# Patient Record
Sex: Male | Born: 1950 | Race: Black or African American | Hispanic: No | Marital: Married | Smoking: Never smoker
Health system: Southern US, Community
[De-identification: ages and names within clinical notes are randomized; demographics above are authoritative.]

## PROBLEM LIST (undated history)

## (undated) DIAGNOSIS — E119 Type 2 diabetes mellitus without complications: Secondary | ICD-10-CM

## (undated) DIAGNOSIS — I10 Essential (primary) hypertension: Secondary | ICD-10-CM

## (undated) DIAGNOSIS — E785 Hyperlipidemia, unspecified: Secondary | ICD-10-CM

## (undated) HISTORY — PX: OTHER SURGICAL HISTORY: SHX169

---

## 1998-06-30 ENCOUNTER — Ambulatory Visit (HOSPITAL_BASED_OUTPATIENT_CLINIC_OR_DEPARTMENT_OTHER): Admission: RE | Admit: 1998-06-30 | Discharge: 1998-06-30 | Payer: Self-pay | Admitting: *Deleted

## 1998-08-27 ENCOUNTER — Ambulatory Visit (HOSPITAL_BASED_OUTPATIENT_CLINIC_OR_DEPARTMENT_OTHER): Admission: RE | Admit: 1998-08-27 | Discharge: 1998-08-27 | Payer: Self-pay | Admitting: Orthopedic Surgery

## 2008-12-09 ENCOUNTER — Emergency Department (HOSPITAL_BASED_OUTPATIENT_CLINIC_OR_DEPARTMENT_OTHER): Admission: EM | Admit: 2008-12-09 | Discharge: 2008-12-09 | Payer: Self-pay | Admitting: Emergency Medicine

## 2008-12-09 ENCOUNTER — Ambulatory Visit: Payer: Self-pay | Admitting: Radiology

## 2012-10-30 ENCOUNTER — Emergency Department (HOSPITAL_BASED_OUTPATIENT_CLINIC_OR_DEPARTMENT_OTHER): Payer: 59

## 2012-10-30 ENCOUNTER — Emergency Department (HOSPITAL_BASED_OUTPATIENT_CLINIC_OR_DEPARTMENT_OTHER)
Admission: EM | Admit: 2012-10-30 | Discharge: 2012-10-31 | Disposition: A | Discharge status: Home / Self Care | Payer: 59 | Attending: Emergency Medicine | Admitting: Emergency Medicine

## 2012-10-30 ENCOUNTER — Encounter (HOSPITAL_BASED_OUTPATIENT_CLINIC_OR_DEPARTMENT_OTHER): Payer: Self-pay | Admitting: Emergency Medicine

## 2012-10-30 DIAGNOSIS — Z8639 Personal history of other endocrine, nutritional and metabolic disease: Secondary | ICD-10-CM | POA: Insufficient documentation

## 2012-10-30 DIAGNOSIS — E119 Type 2 diabetes mellitus without complications: Secondary | ICD-10-CM | POA: Insufficient documentation

## 2012-10-30 DIAGNOSIS — I10 Essential (primary) hypertension: Secondary | ICD-10-CM | POA: Insufficient documentation

## 2012-10-30 DIAGNOSIS — R1013 Epigastric pain: Secondary | ICD-10-CM | POA: Insufficient documentation

## 2012-10-30 DIAGNOSIS — D72829 Elevated white blood cell count, unspecified: Secondary | ICD-10-CM

## 2012-10-30 DIAGNOSIS — K219 Gastro-esophageal reflux disease without esophagitis: Secondary | ICD-10-CM | POA: Insufficient documentation

## 2012-10-30 DIAGNOSIS — Z79899 Other long term (current) drug therapy: Secondary | ICD-10-CM | POA: Insufficient documentation

## 2012-10-30 DIAGNOSIS — Z862 Personal history of diseases of the blood and blood-forming organs and certain disorders involving the immune mechanism: Secondary | ICD-10-CM | POA: Insufficient documentation

## 2012-10-30 HISTORY — DX: Essential (primary) hypertension: I10

## 2012-10-30 HISTORY — DX: Type 2 diabetes mellitus without complications: E11.9

## 2012-10-30 HISTORY — DX: Hyperlipidemia, unspecified: E78.5

## 2012-10-30 LAB — TROPONIN I: Troponin I: <0.3 ng/mL (ref <0.30)

## 2012-10-30 MED ORDER — GI COCKTAIL ~~LOC~~
30.0000 mL | Freq: Once | ORAL | Status: AC
  Administered 2012-10-30: 30 mL via ORAL
  Filled 2012-10-30: qty 30

## 2012-10-30 NOTE — ED Notes (Signed)
Patient transported to X-ray and returned 

## 2012-10-30 NOTE — ED Provider Notes (Signed)
CSN: [id number]     Arrival date & time 10/30/12  2209 History  This chart was scribed for [name], MD by Bennett Scrape, ED Scribe. This patient was seen in room MH06/MH06 and the patient's care was started at 11:07 PM.   First MD Initiated Contact with Patient 10/30/12 2300     Chief Complaint  Patient presents with  . Chest Pain    Patient is a 62 y.o. male presenting with chest pain. The history is provided by the patient. No language interpreter was used.  Chest Pain Pain location:  Substernal area Pain quality: not dull, no pressure and not sharp   Pain radiates to:  Does not radiate Pain radiates to the back: no   Pain severity:  Moderate Onset quality:  Gradual Duration:  18 hours Timing:  Constant Progression:  Unchanged Chronicity:  New Context: not breathing, no movement and no trauma   Relieved by:  Nothing Worsened by:  Nothing tried Ineffective treatments:  None tried Associated symptoms: abdominal pain   Associated symptoms: no cough, no diaphoresis, no fever, no nausea, no palpitations, no shortness of breath, not vomiting and no weakness   Associated symptoms comment:  Pain is below the xiphoid Abdominal pain:    Location:  Epigastric   Severity:  Moderate   Onset quality:  Gradual   Duration:  18 hours   Timing:  Constant   Progression:  Unchanged   Chronicity:  New Risk factors: male sex     HPI Comments: Timothy Rocha is a 62 y.o. male who presents to the Emergency Department complaining of CP episodes located sub sternally/ epigastric. He is hesitant to describe the pain saying "It's not dull or sharp". The pain has been felt constantly since 6:30 AM this morning and has improved since arrival to the ED. He rates his pain a 1 out of 10 currently. He  denies diaphoresis, nausea, cough, neck pain, shoulder pain and arm pain as associated symptoms. He denies any recent long car trips, leg swelling or calf pain. He denies SOB with exertion or  climbing stairs. He reports one recent episode of GERD after drinking lemonade last week but denies having a h/o frequent GERD. He admits that he played golf today with no complications, no shortness of breath and symptoms were not exacerbated by walking nor playing. He has a h/o DM and reports CBG around 200. He also admits to drinking sweet tea at dinner tonight.   PCP is with [facility]  Past Medical History  Diagnosis Date  . Hyperlipemia   . Diabetes mellitus without complication   . Hypertension    Past Surgical History  Procedure Laterality Date  . Rotator cup      No family history on file. History  Substance Use Topics  . Smoking status: Not on file  . Smokeless tobacco: Not on file  . Alcohol Use: Not on file    Review of Systems  Constitutional: Negative for fever, diaphoresis, appetite change and unexpected weight change.  HENT: Negative for neck pain and neck stiffness.   Respiratory: Negative for cough and shortness of breath.   Cardiovascular: Positive for chest pain. Negative for palpitations and leg swelling.  Gastrointestinal: Positive for abdominal pain. Negative for nausea, vomiting and diarrhea.  Skin: Negative for rash.  Neurological: Negative for weakness.  Hematological: Negative for adenopathy. Does not bruise/bleed easily.  All other systems reviewed and are negative.    Allergies  Oxycodone  Home Medications  Current Outpatient Rx  Name  Route  Sig  Dispense  Refill  . lansoprazole (PREVACID) 30 MG capsule   Oral   Take 30 mg by mouth daily.         . metFORMIN (GLUCOPHAGE) 500 MG tablet   Oral   Take 500 mg by mouth 2 (two) times daily with a meal.         . valsartan (DIOVAN) 160 MG tablet   Oral   Take 160 mg by mouth daily.          Triage Vitals: BP 146/91  Pulse 92  Resp 20  Ht 5\' 10"  (1.778 m)  Wt 238 lb (107.956 kg)  BMI 34.15 kg/m2  SpO2 97%  Physical Exam  Nursing note and vitals reviewed. Constitutional:  He is oriented to person, place, and time. He appears well-developed and well-nourished. No distress.  HENT:  Head: Normocephalic and atraumatic.  Mouth/Throat: Oropharynx is clear and moist. No oropharyngeal exudate.  Eyes: Conjunctivae and EOM are normal. Pupils are equal, round, and reactive to light.  Sclera are clear  Neck: Neck supple. No tracheal deviation present.  No epitrochlear, no supraclavicular nor groin lymph adenopathy  Cardiovascular: Normal rate, regular rhythm, normal heart sounds and intact distal pulses.   No murmur heard. Pulmonary/Chest: Effort normal and breath sounds normal. No respiratory distress. He has no wheezes.  Abdominal: Soft. Bowel sounds are normal. He exhibits no distension and no mass. There is tenderness. There is no rebound and no guarding.  Tenderness underneath the xyphoid process in the abdominal cavity, epigastric, No RUQ no LUQ tenderness  Musculoskeletal: Normal range of motion. He exhibits no edema (no ankle swelling).  Lymphadenopathy:    He has no cervical adenopathy.  Neurological: He is alert and oriented to person, place, and time. No cranial nerve deficit.  Skin: Skin is warm and dry. No rash noted.  Psychiatric: He has a normal mood and affect. His behavior is normal.    ED Course   Procedures (including critical care time)  Medications  gi cocktail (Maalox,Lidocaine,Donnatal) (30 mLs Oral Given 10/30/12 2322)    DIAGNOSTIC STUDIES: Oxygen Saturation is 97% on room air, normal by my interpretation.    COORDINATION OF CARE: 11:11 PM-Discussed treatment plan which includes CXR, CBC panel, BMP and troponin with pt at bedside and pt agreed to plan. If admission is necessary, pt request [facility]. 12:25 AM-Pt rechecked and reports improvement with GI cocktail. Will Ct abdomen.  Labs Reviewed  GLUCOSE, CAPILLARY - Abnormal; Notable for the following:    Glucose-Capillary 289 (*)    All other components within normal  limits  CBC WITH DIFFERENTIAL  BASIC METABOLIC PANEL  TROPONIN I   No results found. No diagnosis found.  MDM  No signs of viral or bacterial infections on exam to explain elevated WBC count.  In the setting of > 8 hours of symptoms with a negative EKG and troponin ACS is exclude, symptoms are clearly in the abdomen.  No RUQ tenderness.  No stones seen on CT no bilirubin elevation. GI cocktail relieved symptoms consistent with GERD.  Will add carafate to patient's PPI regimen.    No blasts seen on smear, no enlarged lymph nodes seen on ct scan.  LFTs minimally elevated and consistent with steatosis and not consistent with hepatitis.  Mono is negative.  Patient will need to be ruled out for CLL and viral hepatitis.    EDP spoke with Dr. Ludwig Clarks, on call physician  at [facility] and partner to patient's PMD Dr. Glory Rosebush.  Labs and CT reviewed at length with Dr. Ludwig Clarks who will schedule an appointment with patient for today to do further lab testing and ongoing care.    If you are not called for an appointment with your doctor by noon today, contact them to be seen immediately.  Patient verbalizes understanding and agrees to follow up.      Date: 10/31/2012  Rate: 93  Rhythm: normal sinus rhythm  QRS Axis: normal  Intervals: normal  ST/T Wave abnormalities: normal  Conduction Disutrbances: none  Narrative Interpretation: unremarkable   EDP reviewed labs and CT with patient at length. > 20 minutes spent in consultation  I personally performed the services described in this documentation, which was scribed in my presence. The recorded information has been reviewed and is accurate.    Jasmine Awe, MD 10/31/12 [phone]

## 2012-10-30 NOTE — ED Notes (Signed)
Pt reports feeling tired, worn out last few days, awoke this am with centralized chest pain that radiates to right side of chest, denies n/v, sob

## 2012-10-31 ENCOUNTER — Emergency Department (HOSPITAL_BASED_OUTPATIENT_CLINIC_OR_DEPARTMENT_OTHER): Payer: 59

## 2012-10-31 LAB — CBC WITH DIFFERENTIAL/PLATELET
Basophils Relative: 0 % (ref 0–1)
Eosinophils Relative: 0 % (ref 0–5)
Hemoglobin: 13 g/dL (ref 13.0–17.0)
Lymphs Abs: 19.2 10*3/uL — ABNORMAL HIGH (ref 0.7–4.0)
MCV: 70.9 fL — ABNORMAL LOW (ref 78.0–100.0)
Monocytes Relative: 4 % (ref 3–12)
Neutrophils Relative %: 11 % — ABNORMAL LOW (ref 43–77)
Platelets: 118 10*3/uL — ABNORMAL LOW (ref 150–400)
RBC: 5.64 MIL/uL (ref 4.22–5.81)
Smear Review: DECREASED
WBC: 22.6 10*3/uL — ABNORMAL HIGH (ref 4.0–10.5)

## 2012-10-31 LAB — URINALYSIS, ROUTINE W REFLEX MICROSCOPIC
Leukocytes, UA: NEGATIVE
Nitrite: NEGATIVE
Specific Gravity, Urine: 1.034 — ABNORMAL HIGH (ref 1.005–1.030)
Urobilinogen, UA: 1 mg/dL (ref 0.0–1.0)

## 2012-10-31 LAB — LIPASE, BLOOD: Lipase: 28 U/L (ref 11–59)

## 2012-10-31 LAB — URINE MICROSCOPIC-ADD ON

## 2012-10-31 LAB — HEPATIC FUNCTION PANEL
Albumin: 3.9 g/dL (ref 3.5–5.2)
Alkaline Phosphatase: 165 U/L — ABNORMAL HIGH (ref 39–117)
Indirect Bilirubin: 0.5 mg/dL (ref 0.3–0.9)
Total Bilirubin: 0.6 mg/dL (ref 0.3–1.2)

## 2012-10-31 LAB — BASIC METABOLIC PANEL
Chloride: 102 mEq/L (ref 96–112)
GFR calc Af Amer: 82 mL/min — ABNORMAL LOW (ref >90)
Potassium: 4.4 mEq/L (ref 3.5–5.1)

## 2012-10-31 MED ORDER — SUCRALFATE 1 GM/10ML PO SUSP: 1.0000 g | Freq: Four times a day (QID) | ORAL | Status: DC

## 2012-10-31 MED ORDER — IOHEXOL 300 MG/ML  SOLN
100.0000 mL | Freq: Once | INTRAMUSCULAR | Status: AC | PRN
  Administered 2012-10-31: 100 mL via INTRAVENOUS

## 2012-10-31 MED ORDER — IOHEXOL 300 MG/ML  SOLN
50.0000 mL | Freq: Once | INTRAMUSCULAR | Status: AC | PRN
  Administered 2012-10-31: 50 mL via ORAL

## 2012-10-31 NOTE — ED Notes (Signed)
MD at bedside. 

## 2012-10-31 NOTE — ED Notes (Signed)
Patient transported to CT 

## 2012-10-31 NOTE — ED Notes (Signed)
rx x 1 for carafate. D/c with family

## 2012-10-31 NOTE — ED Notes (Signed)
Pt reports feeling chilled and shivering after finishing oral contrast. EDP made aware

## 2012-10-31 NOTE — ED Notes (Signed)
Pt's family member expressed concerns regarding need for CT scan to [place], [employer]. Lorin Picket, RN at bedside to discuss plan of care with pt and family. Pt now drinking contrast for CT scan

## 2012-10-31 NOTE — ED Notes (Signed)
Pt sts he feels better than when he came in. I discussed pt's abnormal labs and the importance of trying to find a cause for them and that he was free to leave but he could suddenly become sicker. Pt stated "I'm just tired" but he agreed to drink his contrast and have the CT done. Pt's daughter at bedside sts she felt like nothing was being done for the pt. I reviewed the pt's chart and per the timeline pt's only wait was to see the EDP. I explained that it has been a busy night and the EDP has been moving as quickly as she can. Pt friendly and smiling at conclusion of conversation.

## 2012-11-30 ENCOUNTER — Emergency Department (HOSPITAL_BASED_OUTPATIENT_CLINIC_OR_DEPARTMENT_OTHER): Payer: 59

## 2012-11-30 ENCOUNTER — Emergency Department (HOSPITAL_BASED_OUTPATIENT_CLINIC_OR_DEPARTMENT_OTHER)
Admission: EM | Admit: 2012-11-30 | Discharge: 2012-11-30 | Disposition: A | Discharge status: Home / Self Care | Payer: 59 | Attending: Emergency Medicine | Admitting: Emergency Medicine

## 2012-11-30 ENCOUNTER — Encounter (HOSPITAL_BASED_OUTPATIENT_CLINIC_OR_DEPARTMENT_OTHER): Payer: Self-pay | Admitting: *Deleted

## 2012-11-30 DIAGNOSIS — Y92410 Unspecified street and highway as the place of occurrence of the external cause: Secondary | ICD-10-CM | POA: Insufficient documentation

## 2012-11-30 DIAGNOSIS — S42001A Fracture of unspecified part of right clavicle, initial encounter for closed fracture: Secondary | ICD-10-CM

## 2012-11-30 DIAGNOSIS — Z8639 Personal history of other endocrine, nutritional and metabolic disease: Secondary | ICD-10-CM | POA: Insufficient documentation

## 2012-11-30 DIAGNOSIS — Y9389 Activity, other specified: Secondary | ICD-10-CM | POA: Insufficient documentation

## 2012-11-30 DIAGNOSIS — E119 Type 2 diabetes mellitus without complications: Secondary | ICD-10-CM | POA: Insufficient documentation

## 2012-11-30 DIAGNOSIS — Z862 Personal history of diseases of the blood and blood-forming organs and certain disorders involving the immune mechanism: Secondary | ICD-10-CM | POA: Insufficient documentation

## 2012-11-30 DIAGNOSIS — S42009A Fracture of unspecified part of unspecified clavicle, initial encounter for closed fracture: Secondary | ICD-10-CM | POA: Insufficient documentation

## 2012-11-30 DIAGNOSIS — Z79899 Other long term (current) drug therapy: Secondary | ICD-10-CM | POA: Insufficient documentation

## 2012-11-30 DIAGNOSIS — S43101A Unspecified dislocation of right acromioclavicular joint, initial encounter: Secondary | ICD-10-CM

## 2012-11-30 DIAGNOSIS — I10 Essential (primary) hypertension: Secondary | ICD-10-CM | POA: Insufficient documentation

## 2012-11-30 MED ORDER — HYDROCODONE-ACETAMINOPHEN 5-325 MG PO TABS: 1.0000 | ORAL_TABLET | Freq: Four times a day (QID) | ORAL | Status: DC | PRN

## 2012-11-30 MED ORDER — MORPHINE SULFATE 4 MG/ML IJ SOLN
4.0000 mg | Freq: Once | INTRAMUSCULAR | Status: AC
  Administered 2012-11-30: 4 mg via INTRAMUSCULAR
  Filled 2012-11-30: qty 1

## 2012-11-30 NOTE — ED Notes (Signed)
Water and gram crackers given to Pt  
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
## 2013-05-19 DIAGNOSIS — I10 Essential (primary) hypertension: Secondary | ICD-10-CM | POA: Insufficient documentation
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
## 2017-07-21 ENCOUNTER — Telehealth: Payer: Self-pay

## 2017-07-21 NOTE — Telephone Encounter (Signed)
Sent referral to scheduling 

## 2017-09-19 ENCOUNTER — Ambulatory Visit: Payer: Medicare HMO | Admitting: Interventional Cardiology

## 2017-09-19 ENCOUNTER — Encounter: Payer: Self-pay | Admitting: Interventional Cardiology

## 2017-09-19 ENCOUNTER — Encounter

## 2017-09-19 VITALS — BP 130/80 | HR 89 | Ht 70.0 in | Wt 221.0 lb

## 2017-09-19 DIAGNOSIS — I10 Essential (primary) hypertension: Secondary | ICD-10-CM

## 2017-09-19 NOTE — Progress Notes (Signed)
Cardiology Office Note    Date:  09/19/2017   ID:  Timothy Rocha, DOB [birth year], MRN [id number]  PCP:  Dorthula Matas, PA-C  Cardiologist: Lesleigh Noe, MD   Chief Complaint  Patient presents with  . Hypertension    History of Present Illness:  Timothy Rocha is a 67 y.o. male is referred by Lamar Benes, PA-C for of hypertension.  The patient has a long-standing history of hypertension.  He was previously on valsartan.  A national shortage led to switching therapies.  This subsequently did not result in good blood pressure control.  Multiple changes had to be made.  He is now on lisinopril HCTZ 20/12.5 mg with adequate blood pressure control.  Years ago he was on lisinopril and had cough.  It is not recurred.  He denies chest pain, orthopnea, PND, lower extremity edema, palpitations, syncope, and claudication.   No alcohol use.  Uses Aleve only on days that he plays golf, 1 dose of 200 mg.  Past Medical History:  Diagnosis Date  . Diabetes mellitus without complication (HCC)   . Hyperlipemia   . Hypertension     Past Surgical History:  Procedure Laterality Date  . rotator cup       Current Medications: Outpatient Medications Prior to Visit  Medication Sig Dispense Refill  . aspirin EC 81 MG tablet Take 81 mg by mouth.    . bimatoprost (LUMIGAN) 0.01 % SOLN Place 1 drop into both eyes at bedtime.    . Cholecalciferol (VITAMIN D) 2000 units tablet Take 2,000 Units by mouth daily.    . clobetasol cream (TEMOVATE) 0.05 % Apply 1 application topically 2 (two) times daily as needed (dermatitis).    Marland Kitchen desonide (DESOWEN) 0.05 % cream Apply 1 application topically 2 (two) times daily as needed (For beard).    . Dulaglutide (TRULICITY) 1.5 MG/0.5ML SOPN Inject 1.5 mg into the skin once a week.    . Eszopiclone 3 MG TABS Take 3 mg by mouth at bedtime. Take immediately before bedtime    . lansoprazole (PREVACID) 30 MG capsule Take 30 mg by mouth daily.    Marland Kitchen levocetirizine  (XYZAL) 5 MG tablet Take 5 mg by mouth daily.    Marland Kitchen lisinopril-hydrochlorothiazide (PRINZIDE,ZESTORETIC) 20-12.5 MG tablet Take 1 tablet by mouth daily.    . metformin (FORTAMET) 1000 MG (OSM) 24 hr tablet Take 1,000 mg by mouth daily.    Marland Kitchen OVER THE COUNTER MEDICATION Take 2 capsules by mouth at bedtime. ALTERIL NATURAL SLEEP AID    . testosterone enanthate (DELATESTRYL) 200 MG/ML injection Use as directed every two (2) weeks. For IM use only    . canagliflozin (INVOKANA) 100 MG TABS tablet Take 100 mg by mouth.    Marland Kitchen HYDROcodone-acetaminophen (NORCO/VICODIN) 5-325 MG per tablet Take 1-2 tablets by mouth every 6 (six) hours as needed for pain. (Patient not taking: Reported on 09/19/2017) 20 tablet 0  . metFORMIN (GLUCOPHAGE) 500 MG tablet Take 500 mg by mouth 2 (two) times daily with a meal.    . sucralfate (CARAFATE) 1 GM/10ML suspension Take 10 mLs (1 g total) by mouth 4 (four) times daily. (Patient not taking: Reported on 09/19/2017) 420 mL 0  . valsartan (DIOVAN) 160 MG tablet Take 160 mg by mouth daily.    . vitamin C (ASCORBIC ACID) 500 MG tablet Take 500 mg by mouth daily.     No facility-administered medications prior to visit.      Allergies:  Linagliptin-metformin hcl er; Olmesartan; Oxycodone; and Saxagliptin   Social History   Socioeconomic History  . Marital status: Married    Spouse name: Not on file  . Number of children: Not on file  . Years of education: Not on file  . Highest education level: Not on file  Occupational History  . Not on file  Social Needs  . Financial resource strain: Not on file  . Food insecurity:    Worry: Not on file    Inability: Not on file  . Transportation needs:    Medical: Not on file    Non-medical: Not on file  Tobacco Use  . Smoking status: Never Smoker  Substance and Sexual Activity  . Alcohol use: No  . Drug use: No  . Sexual activity: Not on file  Lifestyle  . Physical activity:    Days per week: Not on file    Minutes per  session: Not on file  . Stress: Not on file  Relationships  . Social connections:    Talks on phone: Not on file    Gets together: Not on file    Attends religious service: Not on file    Active member of club or organization: Not on file    Attends meetings of clubs or organizations: Not on file    Relationship status: Not on file  Other Topics Concern  . Not on file  Social History Narrative  . Not on file     Family History:  The patient's family history is not on file.   ROS:   Please see the history of present illness.    CLL is now status post chemotherapy.  Also noted to have diabetes.  Diabetes is under good control.  He is an avid Teacher, English as a foreign language. All other systems reviewed and are negative.   PHYSICAL EXAM:   VS:  BP 130/80   Pulse 89   Ht 5\' 10"  (1.778 m)   Wt 221 lb (100.2 kg)   BMI 31.71 kg/m    GEN: Well nourished, well developed, in no acute distress  HEENT: normal  Neck: no JVD, carotid bruits, or masses Cardiac: RRR; no murmurs, rubs, or gallops,no edema  Respiratory:  clear to auscultation bilaterally, normal work of breathing GI: soft, nontender, nondistended, + BS MS: no deformity or atrophy  Skin: warm and dry, no rash Neuro:  Alert and Oriented x 3, Strength and sensation are intact Psych: euthymic mood, full affect  Wt Readings from Last 3 Encounters:  09/19/17 221 lb (100.2 kg)  11/30/12 234 lb (106.1 kg)  10/30/12 238 lb (108 kg)      Studies/Labs Reviewed:   EKG:  EKG normal sinus rhythm with normal overall appearance.  No prior tracing for comparison  Recent Labs: No results found for requested labs within last 8760 hours.   Lipid Panel No results found for: CHOL, TRIG, HDL, CHOLHDL, VLDL, LDLCALC, LDLDIRECT  Additional studies/ records that were reviewed today include:  none    ASSESSMENT:    1. Essential hypertension      PLAN:  In order of problems listed above:  1. Excellent blood pressure control.  No need for change in  therapy.  If ACE inhibitor induced cough recurs, can switch back to an ARB with low-dose diuretic therapy and achieve reasonable blood pressure control.  We talked about the importance of salt restriction, aerobic activity, avoidance of alcohol, and chronic nonsteroidal anti-inflammatory use.   Overall doing well.  No necessary follow-up unless  issues with blood pressure control.    Medication Adjustments/Labs and Tests Ordered: Current medicines are reviewed at length with the patient today.  Concerns regarding medicines are outlined above.  Medication changes, Labs and Tests ordered today are listed in the Patient Instructions below. There are no Patient Instructions on file for this visit.   Signed, Lesleigh NoeHenry W Ari Bernabei III, MD  09/19/2017 2:49 PM    [facility] [address], [place], [place]  [address] Phone: [phone]; Fax: [phone]

## 2017-09-19 NOTE — Patient Instructions (Signed)
Medication Instructions:  Your physician recommends that you continue on your current medications as directed. Please refer to the Current Medication list given to you today.  Labwork: None  Testing/Procedures: None  Follow-Up: Your physician recommends that you schedule a follow-up appointment as needed with Dr. Katrinka BlazingSmith.   Any Other Special Instructions Will Be Listed Below (If Applicable).  Continue your aerobic activity.  Follow a low salt diet.  Use NSAID medications as little as possible.    If you need a refill on your cardiac medications before your next appointment, please call your pharmacy.

## 2017-10-10 ENCOUNTER — Ambulatory Visit: Payer: Self-pay | Admitting: Interventional Cardiology

## 2018-09-29 ENCOUNTER — Encounter (HOSPITAL_BASED_OUTPATIENT_CLINIC_OR_DEPARTMENT_OTHER): Payer: Self-pay | Admitting: Emergency Medicine

## 2018-09-29 ENCOUNTER — Other Ambulatory Visit: Payer: Self-pay

## 2018-09-29 ENCOUNTER — Emergency Department (HOSPITAL_BASED_OUTPATIENT_CLINIC_OR_DEPARTMENT_OTHER)
Admission: EM | Admit: 2018-09-29 | Discharge: 2018-09-29 | Disposition: A | Discharge status: Home / Self Care | Payer: Medicare HMO | Attending: Emergency Medicine | Admitting: Emergency Medicine

## 2018-09-29 DIAGNOSIS — Z79899 Other long term (current) drug therapy: Secondary | ICD-10-CM | POA: Diagnosis not present

## 2018-09-29 DIAGNOSIS — I10 Essential (primary) hypertension: Secondary | ICD-10-CM | POA: Diagnosis present

## 2018-09-29 DIAGNOSIS — E119 Type 2 diabetes mellitus without complications: Secondary | ICD-10-CM | POA: Insufficient documentation

## 2018-09-29 NOTE — ED Triage Notes (Signed)
Pt states that he suffers from ED and tried a new supplement. Took the prescribed amount of 3 tablets. He states that he feels jittery but checked his bp which was elevated approx 185/95. He took a BP pill and came to emergency department. Denies HA, CP or SHOB.

## 2018-09-29 NOTE — ED Provider Notes (Signed)
[facility] EMERGENCY DEPARTMENT Provider Note   CSN: [id number] Arrival date & time: 09/29/18  2137    History   Chief Complaint Chief Complaint  Patient presents with  . Hypertension    HPI Timothy Rocha is a 68 y.o. male.     HPI   Lisinopril hctz 10-12.5 takes half a pill once every 3 days Took 3 supplement pills for ED--supplements contain maca root, arginine, niacin Felt jittery Took blood pressures at home and was 168/85, then top number was increased and took whole lisnopril-hctz before coming in.  Had previously been on higher BP medications but with diet and exercise was able to decrease to half a pill every 3 days. Did eat fried rice tonight and thinks that may have contributed.  No chest pain, no shortness of breath, no blurred vision, no ringing in ears, no numbness or weakness, no diff talking or walking, no nausea or vomiting   Past Medical History:  Diagnosis Date  . Diabetes mellitus without complication (HCC)   . Hyperlipemia   . Hypertension     There are no active problems to display for this patient.   Past Surgical History:  Procedure Laterality Date  . rotator cup           Home Medications    Prior to Admission medications   Medication Sig Start Date End Date Taking? Authorizing Provider  aspirin EC 81 MG tablet Take 81 mg by mouth.    [provider]  bimatoprost (LUMIGAN) 0.01 % SOLN Place 1 drop into both eyes at bedtime.    [provider]  Cholecalciferol (VITAMIN D) 2000 units tablet Take 2,000 Units by mouth daily. 10/31/12   [provider]  clobetasol cream (TEMOVATE) 0.05 % Apply 1 application topically 2 (two) times daily as needed (dermatitis).    [provider]  desonide (DESOWEN) 0.05 % cream Apply 1 application topically 2 (two) times daily as needed (For beard).    [provider]  Dulaglutide (TRULICITY) 1.5 MG/0.5ML SOPN Inject 1.5 mg into the skin once a week.     [provider]  Eszopiclone 3 MG TABS Take 3 mg by mouth at bedtime. Take immediately before bedtime    [provider]  lansoprazole (PREVACID) 30 MG capsule Take 30 mg by mouth daily.    [provider]  levocetirizine (XYZAL) 5 MG tablet Take 5 mg by mouth daily.    [provider]  lisinopril-hydrochlorothiazide (PRINZIDE,ZESTORETIC) 20-12.5 MG tablet Take 1 tablet by mouth daily.    [provider]  metformin (FORTAMET) 1000 MG (OSM) 24 hr tablet Take 1,000 mg by mouth daily.    [provider]  OVER THE COUNTER MEDICATION Take 2 capsules by mouth at bedtime. ALTERIL NATURAL SLEEP AID    [provider]  testosterone enanthate (DELATESTRYL) 200 MG/ML injection Use as directed every two (2) weeks. For IM use only    [provider]    Family History No family history on file.  Social History Social History   Tobacco Use  . Smoking status: Never Smoker  . Smokeless tobacco: Never Used  Substance Use Topics  . Alcohol use: No  . Drug use: No     Allergies   Linagliptin-metformin hcl er, Olmesartan, Oxycodone, and Saxagliptin   Review of Systems Review of Systems  Constitutional: Negative for fever.  HENT: Negative for sore throat.   Eyes: Negative for visual disturbance.  Respiratory: Negative for shortness of  breath.   Cardiovascular: Negative for chest pain.  Gastrointestinal: Negative for abdominal pain, nausea and vomiting.  Genitourinary: Negative for difficulty urinating.  Musculoskeletal: Negative for back pain.  Skin: Negative for rash.  Neurological: Negative for syncope, facial asymmetry, speech difficulty, weakness, numbness and headaches.     Physical Exam Updated Vital Signs BP (!) 156/94 (BP Location: Right Arm)   Pulse 80   Temp 98.3 F (36.8 C) (Oral)   Resp 20   Ht 5\' 10"  (1.778 m)   Wt 95.3 kg   SpO2 100%   BMI 30.13 kg/m   Physical Exam Vitals signs and nursing note  reviewed.  Constitutional:      General: He is not in acute distress.    Appearance: He is well-developed. He is not diaphoretic.  HENT:     Head: Normocephalic and atraumatic.  Eyes:     Conjunctiva/sclera: Conjunctivae normal.  Neck:     Musculoskeletal: Normal range of motion.  Cardiovascular:     Rate and Rhythm: Normal rate and regular rhythm.     Heart sounds: Normal heart sounds. No murmur. No friction rub. No gallop.   Pulmonary:     Effort: Pulmonary effort is normal. No respiratory distress.     Breath sounds: Normal breath sounds. No wheezing or rales.  Abdominal:     General: There is no distension.     Palpations: Abdomen is soft.     Tenderness: There is no abdominal tenderness. There is no guarding.  Skin:    General: Skin is warm and dry.  Neurological:     Mental Status: He is alert and oriented to person, place, and time.     GCS: GCS eye subscore is 4. GCS verbal subscore is 5. GCS motor subscore is 6.     Cranial Nerves: Cranial nerves are intact.     Sensory: Sensation is intact. No sensory deficit.     Motor: Motor function is intact.      ED Treatments / Results  Labs (all labs ordered are listed, but only abnormal results are displayed) Labs Reviewed - No data to display  EKG None  Radiology No results found.  Procedures Procedures (including critical care time)  Medications Ordered in ED Medications - No data to display   Initial Impression / Assessment and Plan / ED Course  I have reviewed the triage vital signs and the nursing notes.  Pertinent labs & imaging results that were available during my care of the patient were reviewed by me and considered in my medical decision making (see chart for details).        68yo male with history of hypertension, hyperlipidemia, DM, who presents with concern for elevated blood pressure after taking a supplement tonight.  On arrival to the ED, BP was 841Y systolic.  Patient without headache, no  neurologic symptoms, no chest pain, no shortness of breath and have low suspicion for hypertensive emergencies including low suspicion for [facility], hypertensive encephalopathy, stroke, MI, aortic dissection, pulmonary edema.  His blood pressure decreased to 156/94 in the ED.  Discussed importance of close primary care follow up and reasons to return to the ED in detail. Recommend taking normal 1 pill dosage of lisionopril 10 hctz 12.5 tomorrow. Patient discharged in stable condition with understanding of reasons to return.    Final Clinical Impressions(s) / ED Diagnoses   Final diagnoses:  Essential hypertension    ED Discharge Orders    None  Alvira MondaySchlossman, Adeyemi Hamad, MD 09/30/18 [phone]

## 2019-01-08 NOTE — Progress Notes (Signed)
Cardiology Office Note:    Date:  01/09/2019   ID:  Timothy Rocha, DOB 20-Nov-1950, MRN [id number]  PCP:  Jamesetta Orleans, PA-C  Cardiologist:  No primary care provider on file.   Referring MD: Jamesetta Orleans, PA-C   Chief Complaint  Patient presents with  . Hypertension    History of Present Illness:    Timothy Rocha is a 68 y.o. male with a hx of hypertension, hyperlipidemia, and DM II.  Timothy Rocha had difficulty controlling his blood pressure of late.  He has been taking lisinopril 10/12.5 mg once or twice a week.  He has been measuring his blood pressure sporadically and taking the medication when he feels the numbers are too high.  He feels that less than 140/90 mmHg is an adequate blood pressure control.  He had beautiful control of his blood pressure on Diovan HCT but eventually due to recalls the medication was switched to other agents and he had trouble.  Olmesartan was 1 of the medications that was tried and he developed a rash.  Lisinopril is been used without any difficulty related to hives or itching.  He continues to use this now but not more than twice per week.  Denies chest pain, shortness of breath, syncope, and edema.  Past Medical History:  Diagnosis Date  . Diabetes mellitus without complication ([place])   . Hyperlipemia   . Hypertension     Past Surgical History:  Procedure Laterality Date  . rotator cup       Current Medications: Current Meds  Medication Sig  . aspirin EC 81 MG tablet Take 81 mg by mouth.  Marland Kitchen atorvastatin (LIPITOR) 10 MG tablet Take 10 mg by mouth daily.  . bimatoprost (LUMIGAN) 0.01 % SOLN Place 1 drop into both eyes at bedtime.  . Cholecalciferol (VITAMIN D) 2000 units tablet Take 2,000 Units by mouth daily.  . clobetasol cream (TEMOVATE) 7.82 % Apply 1 application topically 2 (two) times daily as needed (dermatitis).  Marland Kitchen desonide (DESOWEN) 0.05 % cream Apply 1 application topically 2 (two) times daily as needed (For beard).  . Dulaglutide  (TRULICITY) 1.5 UM/3.5TI SOPN Inject 1.5 mg into the skin once a week.  . Eszopiclone 3 MG TABS Take 3 mg by mouth at bedtime. Take immediately before bedtime  . lansoprazole (PREVACID) 30 MG capsule Take 30 mg by mouth daily.  Marland Kitchen levocetirizine (XYZAL) 5 MG tablet Take 5 mg by mouth daily.  . metformin (FORTAMET) 1000 MG (OSM) 24 hr tablet Take 1,000 mg by mouth daily.  Marland Kitchen OVER THE COUNTER MEDICATION Take 2 capsules by mouth at bedtime. ALTERIL NATURAL SLEEP AID  . Tadalafil 2.5 MG TABS Take 2.5 mg by mouth daily.  Marland Kitchen testosterone enanthate (DELATESTRYL) 200 MG/ML injection Use as directed every two (2) weeks. For IM use only  . [DISCONTINUED] lisinopril-hydrochlorothiazide (PRINZIDE,ZESTORETIC) 20-12.5 MG tablet Take 1 tablet by mouth daily.     Allergies:   Linagliptin-metformin hcl er, Olmesartan, Oxycodone, and Saxagliptin   Social History   Socioeconomic History  . Marital status: Married    Spouse name: Not on file  . Number of children: Not on file  . Years of education: Not on file  . Highest education level: Not on file  Occupational History  . Not on file  Social Needs  . Financial resource strain: Not on file  . Food insecurity    Worry: Not on file    Inability: Not on file  . Transportation needs  Medical: Not on file    Non-medical: Not on file  Tobacco Use  . Smoking status: Never Smoker  . Smokeless tobacco: Never Used  Substance and Sexual Activity  . Alcohol use: No  . Drug use: No  . Sexual activity: Not on file  Lifestyle  . Physical activity    Days per week: Not on file    Minutes per session: Not on file  . Stress: Not on file  Relationships  . Social Musician on phone: Not on file    Gets together: Not on file    Attends religious service: Not on file    Active member of club or organization: Not on file    Attends meetings of clubs or organizations: Not on file    Relationship status: Not on file  Other Topics Concern  . Not on  file  Social History Narrative  . Not on file     Family History: The patient's family history is not on file.  ROS:   Please see the history of present illness.    Active, plays golf several times per week.  Recently closed his right thumb in the car door.  All other systems reviewed and are negative.  EKGs/Labs/Other Studies Reviewed:    The following studies were reviewed today: No new data.  I am unable to find a recent lipid panel.  EKG:  EKG normal sinus rhythm, insignificant inferior Q waves.  Prominent voltage is noted.  When compared to the prior tracing in June 2019, no significant changes noted.  Recent Labs: No results found for requested labs within last 8760 hours.  Recent Lipid Panel No results found for: CHOL, TRIG, HDL, CHOLHDL, VLDL, LDLCALC, LDLDIRECT  Physical Exam:    VS:  BP (!) 142/84   Pulse 82   Ht 5\' 10"  (1.778 m)   Wt 221 lb 3.2 oz (100.3 kg)   SpO2 96%   BMI 31.74 kg/m     Wt Readings from Last 3 Encounters:  01/09/19 221 lb 3.2 oz (100.3 kg)  09/29/18 210 lb (95.3 kg)  09/19/17 221 lb (100.2 kg)     GEN: Obese.. No acute distress HEENT: Normal NECK: No JVD. LYMPHATICS: No lymphadenopathy CARDIAC:  RRR without murmur, gallop, or edema. VASCULAR:  Normal Pulses. No bruits. RESPIRATORY:  Clear to auscultation without rales, wheezing or rhonchi  ABDOMEN: Soft, non-tender, non-distended, No pulsatile mass, MUSCULOSKELETAL: No deformity  SKIN: Warm and dry NEUROLOGIC:  Alert and oriented x 3 PSYCHIATRIC:  Normal affect   ASSESSMENT:    1. Essential hypertension   2. Controlled type 2 diabetes mellitus without complication, without long-term current use of insulin (HCC)   3. Other hyperlipidemia   4. Educated about COVID-19 virus infection    PLAN:    In order of problems listed above:  1. Blood pressure is elevated today.  I repeated the blood pressure and then both arms it was 150/90 mmHg.  Low-salt diet is advocated.  Lisinopril  HCT is being discontinued.  Losartan HCT 50/12.5 mg daily is recommended.  2-week follow-up in blood pressure clinic.  Basic metabolic panel at that time.  Most recent being that was normal in July. 2. The patient needs renin angiotensin aldosterone system blockade for renal protection given his history of diabetes. 3. No recent lipid panel.  LDL target should be less than 70 given history of diabetes. 4. The 3W's were discussed and the patient endorsed compliance.  48-month clinical follow-up  to see me.  Blood pressure clinic to achieve control.  Aspired target blood pressure 130/80 mmHg or less.   Medication Adjustments/Labs and Tests Ordered: Current medicines are reviewed at length with the patient today.  Concerns regarding medicines are outlined above.  Orders Placed This Encounter  Procedures  . Basic metabolic panel  . EKG 12-Lead   Meds ordered this encounter  Medications  . losartan-hydrochlorothiazide (HYZAAR) 50-12.5 MG tablet    Sig: Take 1 tablet by mouth daily.    Dispense:  90 tablet    Refill:  3    D/c lisinopril hctz    Patient Instructions  Medication Instructions:  1) DISCONTINUE Lisinopril/HCTZ 2) START Losartan/HCTZ 50/12.5mg  once daily  If you need a refill on your cardiac medications before your next appointment, please call your pharmacy.   Lab work: BMET when you come back to see the Hypertension Clinic  If you have labs (blood work) drawn today and your tests are completely normal, you will receive your results only by: Marland Kitchen. MyChart Message (if you have MyChart) OR . A paper copy in the mail If you have any lab test that is abnormal or we need to change your treatment, we will call you to review the results.  Testing/Procedures: None  Follow-Up:  Your physician recommends that you schedule a follow-up appointment in: 2 weeks with the Hypertension Clinic.   At [facility], you and your health needs are our priority.  As part of our continuing  mission to provide you with exceptional heart care, we have created designated Provider Care Teams.  These Care Teams include your primary Cardiologist (physician) and Advanced Practice Providers (APPs -  Physician Assistants and Nurse Practitioners) who all work together to provide you with the care you need, when you need it. You will need a follow up appointment in 6 months.  Please call our office 2 months in advance to schedule this appointment.  You may see Dr. Verdis PrimeHenry Aundraya Dripps or one of the following Advanced Practice Providers on your designated Care Team:   Norma FredricksonLori Gerhardt, NP Nada BoozerLaura Ingold, NP . Georgie ChardJill McDaniel, NP  Any Other Special Instructions Will Be Listed Below (If Applicable).        Signed, Lesleigh NoeHenry W Jahbari Repinski III, MD  01/09/2019 5:27 PM     [facility]

## 2019-01-09 ENCOUNTER — Ambulatory Visit: Payer: Medicare HMO | Admitting: Interventional Cardiology

## 2019-01-09 ENCOUNTER — Encounter: Payer: Self-pay | Admitting: Interventional Cardiology

## 2019-01-09 ENCOUNTER — Other Ambulatory Visit: Payer: Self-pay

## 2019-01-09 ENCOUNTER — Encounter

## 2019-01-09 VITALS — BP 142/84 | HR 82 | Ht 70.0 in | Wt 221.2 lb

## 2019-01-09 DIAGNOSIS — I10 Essential (primary) hypertension: Secondary | ICD-10-CM | POA: Diagnosis not present

## 2019-01-09 DIAGNOSIS — E7849 Other hyperlipidemia: Secondary | ICD-10-CM

## 2019-01-09 DIAGNOSIS — E119 Type 2 diabetes mellitus without complications: Secondary | ICD-10-CM

## 2019-01-09 DIAGNOSIS — Z7189 Other specified counseling: Secondary | ICD-10-CM

## 2019-01-09 MED ORDER — LOSARTAN POTASSIUM-HCTZ 50-12.5 MG PO TABS: 1.0000 | ORAL_TABLET | Freq: Every day | ORAL | 3 refills | Status: DC

## 2019-01-09 NOTE — Patient Instructions (Signed)
Medication Instructions:  1) DISCONTINUE Lisinopril/HCTZ 2) START Losartan/HCTZ 50/12.5mg  once daily  If you need a refill on your cardiac medications before your next appointment, please call your pharmacy.   Lab work: BMET when you come back to see the Hypertension Clinic  If you have labs (blood work) drawn today and your tests are completely normal, you will receive your results only by: Marland Kitchen MyChart Message (if you have MyChart) OR . A paper copy in the mail If you have any lab test that is abnormal or we need to change your treatment, we will call you to review the results.  Testing/Procedures: None  Follow-Up:  Your physician recommends that you schedule a follow-up appointment in: 2 weeks with the Hypertension Clinic.   At [facility], you and your health needs are our priority.  As part of our continuing mission to provide you with exceptional heart care, we have created designated Provider Care Teams.  These Care Teams include your primary Cardiologist (physician) and Advanced Practice Providers (APPs -  Physician Assistants and Nurse Practitioners) who all work together to provide you with the care you need, when you need it. You will need a follow up appointment in 6 months.  Please call our office 2 months in advance to schedule this appointment.  You may see Dr. Daneen Schick or one of the following Advanced Practice Providers on your designated Care Team:   Truitt Merle, NP Cecilie Kicks, NP . Kathyrn Drown, NP  Any Other Special Instructions Will Be Listed Below (If Applicable).

## 2019-01-24 NOTE — Patient Instructions (Addendum)
Nice to meet you today!  Keep up the good work with diet and exercise. Aim for a diet full of vegetables, fruit and lean meats (chicken, [place], fish). Try to limit salt intake by eating fresh or frozen vegetables (instead of canned), rinse canned vegetables prior to cooking and do not add any additional salt to meals.   Your goal blood pressure is < 130/80 mmHg. In clinic, your blood pressure was 122/64 mmHg.   Continue taking losartan/HCTZ 50-12.5 mg daily and atorvastatin (Lipitor) 10mg  daily   Monitor blood pressure at home daily and keep a log (on your phone or piece of paper) to bring with you to your next visit. Write down date, time, blood pressure and pulse.  We will call you with lab results early next week.    Please give [place] a call at [phone] with any questions or concerns.

## 2019-01-24 NOTE — Progress Notes (Deleted)
Patient ID: Timothy Rocha                 DOB: March 03, 1951                      MRN: [id number]     HPI: Timothy Rocha is a 68 y.o. male referred by Dr. Katrinka Blazing to HTN clinic. PMH is significant for HTN. HLD, and T2DM.  At last appt with Dr. Katrinka Blazing on 01/09/19 pt admitted to taking lisinopril 10/12.5 mg once or twice a week. Pt stated he was measuring his blood pressure sporadically and taking the medication when he feels the numbers are too high. Pt feels that a BP goal of <140/90 mmHg is adequate. Dr. Katrinka Blazing noted that he noted a BP reading of 150/90 mmHg in both arms of pt.   Pt presents today for initial HTN/HLD appt.   Prior lipid meds? Was he on atorvastatin in 2017?  Current HTN meds: losartan/HCTZ 50-12.5 mg daily Previously tried: olmesartan (rash), valsartan (prior drug recall), lisinopril/HCTZ (switched to losartan/HCTZ) BP goal: <130/80 mmHg  Current HLD meds: atorvastatin 10 mg daily Previously tried:  LDL goal: <100 mg/dL  Family History:   Social History:   Diet:   Exercise:   Home BP readings:   Lipid panel: 06/12/2015 [facility]: TC 83 TG 79 HDL 27 LDL 40 VLDL 15.8  The 10-year ASCVD risk score Denman George DC Jr., et al., 2013) is: 34.5%   Values used to calculate the score:     Age: 88 years     Sex: Male     Is Non-Hispanic African American: Yes     Diabetic: Yes     Tobacco smoker: No     Systolic Blood Pressure: 142 mmHg     Is BP treated: Yes     HDL Cholesterol: 41 MG/DL     Total Cholesterol: 144 MG/DL   Wt Readings from Last 3 Encounters:  01/09/19 221 lb 3.2 oz (100.3 kg)  09/29/18 210 lb (95.3 kg)  09/19/17 221 lb (100.2 kg)   BP Readings from Last 3 Encounters:  01/09/19 (!) 142/84  09/29/18 (!) 156/94  09/19/17 130/80   Pulse Readings from Last 3 Encounters:  01/09/19 82  09/29/18 80  09/19/17 89    Renal function: CrCl cannot be calculated (Patient's most recent lab result is older than the maximum 21 days allowed.).  Past Medical History:   Diagnosis Date  . Diabetes mellitus without complication (HCC)   . Hyperlipemia   . Hypertension     Current Outpatient Medications on File Prior to Visit  Medication Sig Dispense Refill  . aspirin EC 81 MG tablet Take 81 mg by mouth.    Marland Kitchen atorvastatin (LIPITOR) 10 MG tablet Take 10 mg by mouth daily.    . bimatoprost (LUMIGAN) 0.01 % SOLN Place 1 drop into both eyes at bedtime.    . Cholecalciferol (VITAMIN D) 2000 units tablet Take 2,000 Units by mouth daily.    . clobetasol cream (TEMOVATE) 0.05 % Apply 1 application topically 2 (two) times daily as needed (dermatitis).    Marland Kitchen desonide (DESOWEN) 0.05 % cream Apply 1 application topically 2 (two) times daily as needed (For beard).    . Dulaglutide (TRULICITY) 1.5 MG/0.5ML SOPN Inject 1.5 mg into the skin once a week.    . Eszopiclone 3 MG TABS Take 3 mg by mouth at bedtime. Take immediately before bedtime    . lansoprazole (PREVACID) 30 MG capsule  Take 30 mg by mouth daily.    Marland Kitchen levocetirizine (XYZAL) 5 MG tablet Take 5 mg by mouth daily.    Marland Kitchen losartan-hydrochlorothiazide (HYZAAR) 50-12.5 MG tablet Take 1 tablet by mouth daily. 90 tablet 3  . metformin (FORTAMET) 1000 MG (OSM) 24 hr tablet Take 1,000 mg by mouth daily.    Marland Kitchen OVER THE COUNTER MEDICATION Take 2 capsules by mouth at bedtime. ALTERIL NATURAL SLEEP AID    . Tadalafil 2.5 MG TABS Take 2.5 mg by mouth daily.    Marland Kitchen testosterone enanthate (DELATESTRYL) 200 MG/ML injection Use as directed every two (2) weeks. For IM use only     No current facility-administered medications on file prior to visit.     Allergies  Allergen Reactions  . Linagliptin-Metformin Hcl Er Other (See Comments)    hives  . Olmesartan Other (See Comments)    hives  . Oxycodone   . Saxagliptin Itching     Assessment/Plan:  1. Hypertension - BP goal <130/80 mmHG; therefore, pt is not at goal. Initiate amlodipine 5 mg daily (HR stable). Continue losartan/HCTZ 50-12.5 mg daily. F/u with pt in 1 month.    2. Hyperlipidemia - LDL goal < 70 mg/dL; therefore, it appears pt is at goal.  Pt's ASCVD risk is >20%; therefore, increase atorvastatin from 10 mg daily to 40 mg daily. It is important to consider that last lipid panel was obtained in 2017. Plan to obtain updated lipid panel/LFTs prior to atorvastatin dose increase.  Thank you for involving pharmacy to assist in providing [name]'s care.   Drexel Iha, PharmD PGY2 Ambulatory Care Pharmacy Resident

## 2019-01-26 ENCOUNTER — Ambulatory Visit (INDEPENDENT_AMBULATORY_CARE_PROVIDER_SITE_OTHER): Payer: Medicare HMO | Admitting: Pharmacist

## 2019-01-26 ENCOUNTER — Other Ambulatory Visit: Payer: Self-pay

## 2019-01-26 ENCOUNTER — Other Ambulatory Visit: Payer: Medicare HMO | Admitting: *Deleted

## 2019-01-26 VITALS — BP 122/64 | HR 88

## 2019-01-26 DIAGNOSIS — I10 Essential (primary) hypertension: Secondary | ICD-10-CM | POA: Diagnosis not present

## 2019-01-26 DIAGNOSIS — E7849 Other hyperlipidemia: Secondary | ICD-10-CM

## 2019-01-26 DIAGNOSIS — E119 Type 2 diabetes mellitus without complications: Secondary | ICD-10-CM

## 2019-01-26 MED ORDER — ATORVASTATIN CALCIUM 40 MG PO TABS: 40.0000 mg | ORAL_TABLET | Freq: Every day | ORAL | 3 refills | Status: DC

## 2019-01-26 NOTE — Progress Notes (Signed)
Patient ID: Timothy Rocha                 DOB: [birth year]                      MRN: [id number]     HPI: Timothy Rocha is a 68 y.o. male referred by Dr. Katrinka Blazing to HTN clinic. PMH is significant for HTN, HLD, and T2DM. At last appt with Dr. Katrinka Blazing on 01/09/19 pt admitted to taking lisinopril 10/12.5 mg once or twice a week. Pt stated he was measuring his blood pressure sporadically and taking the medication when he feels the numbers are too high. Pt feels that a BP goal of <140/90 mmHg is adequate. Dr. Katrinka Blazing noted that he noted a BP reading of 150/90 mmHg in both arms of pt.   Patient presents to clinic today in good spirits. He endorses taking losartan/HCTZ 50-12.5 mg every day. Diligently takes medication in the morning and checks BP once or twice per day, reported from log 100-120s/60s-70s mmHg. In clinic today, his BP was 122/64 mmHg.   He had a lipid panel and LFTs drawn today prior to pharmacy visit. He reports beginning atorvastatin 10mg  12/2018 after visiting endocrinologist (Dr. 01/2019 with [facility]). Current ASCVD risk is 34.5%.   He reports a diet consisting of 2 boiled eggs, grilled chicken wrap, salads, meats and green vegetables. He enjoys no fat jello as a snack and drinks crystal light tea or water. Patient reports exercising by walking regularly, usually getting at least 6,000 steps or more a day. On days he golfs or does yard work he gets 12,000.    Current HTN meds: losartan/HCTZ 50-12.5 mg daily  Previously tried: olmesartan (rash), valsartan (prior drug recall), lisinopril/HCTZ (switched to losartan/HCTZ) BP goal: <130/80 mmHg  Current HLN meds: atorvastatin 10 mg daily  Previously tried: none LDL goal: <100 mg/dL  Family History: unknown  Social History: no smoking or illicit drugs. Wine a few times per year  Diet:  Breakfast - 2 boiled eggs, crystal light tea Lunch - grilled chicken wrap, crystal light tea Snack - small cup no fat jello x 2 a day Dinner - salad,  meat + green vegetables, grilled chicken cesar salad, fried fish Beverages - half and half tea, water, alcohol occasionally wine> 4-5 glasses a year  Exercise: walking during day or evening, treadmill. Usually meets 6,000 steps a day  Home BP readings: 113/73, 121/70, 128/74, 115/73, 117/77, 115/73, 100/63, 101/67, 111/67 (mostly evening readings - after starting   Lipid panel:  06/12/2015 [facility]: TC 83 TG 79 HDL 27 LDL 40 VLDL 15.8  The 10-year ASCVD risk score 08/12/2015 DC Jr., et al., 2013) is: 34.5%  Values used to calculate the score:  Age: 46 years  Sex: Male  Is Non-Hispanic African American: Yes  Diabetic: Yes  Tobacco smoker: No  Systolic Blood Pressure: 142 mmHg  Is BP treated: Yes  HDL Cholesterol: 41 MG/DL  Total Cholesterol: 79 MG/D   Wt Readings from Last 3 Encounters:  01/09/19 221 lb 3.2 oz (100.3 kg)  09/29/18 210 lb (95.3 kg)  09/19/17 221 lb (100.2 kg)   BP Readings from Last 3 Encounters:  01/26/19 122/64  01/09/19 (!) 142/84  09/29/18 (!) 156/94   Pulse Readings from Last 3 Encounters:  01/26/19 88  01/09/19 82  09/29/18 80    Renal function: CrCl cannot be calculated (Patient's most recent lab result is older than the maximum 21 days  allowed.).  Past Medical History:  Diagnosis Date  . Diabetes mellitus without complication ([place])   . Hyperlipemia   . Hypertension     Current Outpatient Medications on File Prior to Visit  Medication Sig Dispense Refill  . aspirin EC 81 MG tablet Take 81 mg by mouth.    . bimatoprost (LUMIGAN) 0.01 % SOLN Place 1 drop into both eyes at bedtime.    . Cholecalciferol (VITAMIN D) 2000 units tablet Take 2,000 Units by mouth daily.    . clobetasol cream (TEMOVATE) 8.10 % Apply 1 application topically 2 (two) times daily as needed (dermatitis).    Marland Kitchen desonide (DESOWEN) 0.05 % cream Apply 1 application topically 2 (two) times daily as needed (For beard).    . Dulaglutide (TRULICITY) 1.5 FB/5.1WC SOPN Inject 1.5 mg into the  skin once a week.    . Eszopiclone 3 MG TABS Take 3 mg by mouth at bedtime. Take immediately before bedtime    . lansoprazole (PREVACID) 30 MG capsule Take 30 mg by mouth daily.    Marland Kitchen levocetirizine (XYZAL) 5 MG tablet Take 5 mg by mouth daily.    Marland Kitchen losartan-hydrochlorothiazide (HYZAAR) 50-12.5 MG tablet Take 1 tablet by mouth daily. 90 tablet 3  . metformin (FORTAMET) 1000 MG (OSM) 24 hr tablet Take 1,000 mg by mouth daily.    Marland Kitchen OVER THE COUNTER MEDICATION Take 2 capsules by mouth at bedtime. ALTERIL NATURAL SLEEP AID    . Tadalafil 2.5 MG TABS Take 2.5 mg by mouth daily.    Marland Kitchen testosterone enanthate (DELATESTRYL) 200 MG/ML injection Use as directed every two (2) weeks. For IM use only     No current facility-administered medications on file prior to visit.     Allergies  Allergen Reactions  . Linagliptin-Metformin Hcl Er Other (See Comments)    hives  . Olmesartan Other (See Comments)    hives  . Oxycodone   . Saxagliptin Itching     Assessment/Plan:  1. Hypertension - Patient is at goal < 130/80 mmHg. Continue current losartan/HCTZ 50-12.5mg  daily. Will follow up in 1 month to assess control.   2. HLD - Pt's ASCVD risk is >20%; therefore, increase atorvastatin from 10 mg daily to 40 mg daily. Will follow up with patient on Monday with lab results. Could consider reaching out to endocrinologist for previous results to see baseline in September.   [place], [place] 01/26/2019 4:43 PM  Melissa D Prentiss Bells, Pharm.D, [facility]  [address] [place], [place] [address]  Phone: [phone]; Fax: [phone]

## 2019-01-27 LAB — BASIC METABOLIC PANEL
BUN/Creatinine Ratio: 13 (ref 10–24)
BUN: 20 mg/dL (ref 8–27)
CO2: 21 mmol/L (ref 20–29)
Calcium: 9.8 mg/dL (ref 8.6–10.2)
Chloride: 99 mmol/L (ref 96–106)
Creatinine, Ser: 1.57 mg/dL — ABNORMAL HIGH (ref 0.76–1.27)
GFR calc Af Amer: 52 mL/min/{1.73_m2} — ABNORMAL LOW (ref >59)
GFR calc non Af Amer: 45 mL/min/{1.73_m2} — ABNORMAL LOW (ref >59)
Glucose: 214 mg/dL — ABNORMAL HIGH (ref 65–99)
Potassium: 4.5 mmol/L (ref 3.5–5.2)
Sodium: 139 mmol/L (ref 134–144)

## 2019-01-27 LAB — HEPATIC FUNCTION PANEL
ALT: 71 IU/L — ABNORMAL HIGH (ref 0–44)
AST: 51 IU/L — ABNORMAL HIGH (ref 0–40)
Albumin: 4.6 g/dL (ref 3.8–4.8)
Alkaline Phosphatase: 121 IU/L — ABNORMAL HIGH (ref 39–117)
Bilirubin Total: 0.8 mg/dL (ref 0.0–1.2)
Bilirubin, Direct: 0.2 mg/dL (ref 0.00–0.40)
Total Protein: 6.9 g/dL (ref 6.0–8.5)

## 2019-01-27 LAB — LIPID PANEL
Chol/HDL Ratio: 3.4 ratio (ref 0.0–5.0)
Cholesterol, Total: 120 mg/dL (ref 100–199)
HDL: 35 mg/dL — ABNORMAL LOW (ref >39)
LDL Chol Calc (NIH): 40 mg/dL (ref 0–99)
Triglycerides: 296 mg/dL — ABNORMAL HIGH (ref 0–149)
VLDL Cholesterol Cal: 45 mg/dL — ABNORMAL HIGH (ref 5–40)

## 2019-01-31 ENCOUNTER — Telehealth: Payer: Self-pay | Admitting: *Deleted

## 2019-01-31 DIAGNOSIS — R7989 Other specified abnormal findings of blood chemistry: Secondary | ICD-10-CM

## 2019-01-31 DIAGNOSIS — R748 Abnormal levels of other serum enzymes: Secondary | ICD-10-CM

## 2019-01-31 NOTE — Telephone Encounter (Signed)
-----   Message from Belva Crome, MD sent at 01/31/2019  8:53 AM EDT ----- Let the patient know The kidney function needs close monitoring. Lipids are good but has chronic elevation in liver enzymes. Repeat liver and BMET 2-3 weeks. Avoid alcohol if any. A copy will be sent to [place], Pamalee Leyden, PA-C

## 2019-01-31 NOTE — Telephone Encounter (Signed)
I placed call to pt and left message to call office.  

## 2019-02-01 NOTE — Telephone Encounter (Signed)
Left message to call back  

## 2019-02-01 NOTE — Telephone Encounter (Signed)
Follow up    Please return call to patient with results 

## 2019-02-01 NOTE — Telephone Encounter (Signed)
Patient returning call.

## 2019-02-01 NOTE — Telephone Encounter (Signed)
Attempted to contact pt x 2.  Phone rang half a ring and then went silent.  No VM pick up this time.  Will try again later.

## 2019-02-01 NOTE — Telephone Encounter (Signed)
Spoke with pt and went over results and recommendations.  Pt agreeable to plan.  He will come on 11/12 for labs.  Pt appreciative for call.

## 2019-02-08 ENCOUNTER — Telehealth: Payer: Self-pay | Admitting: Interventional Cardiology

## 2019-02-08 DIAGNOSIS — E7849 Other hyperlipidemia: Secondary | ICD-10-CM

## 2019-02-08 NOTE — Telephone Encounter (Signed)
Attempted to contact pt to see if aching in back and side developed or worsened after starting the higher does of Atorvastatin.  Left message to call back.  Will route to Pharmacy team as they increased the dose at a recent [facility] appt.

## 2019-02-08 NOTE — Telephone Encounter (Signed)
Spoke with pt and he said since starting the increased dose of Atorvastatin he has felt awful and that is when the aching in back and side started.  Advised I will update PharmD team.

## 2019-02-08 NOTE — Telephone Encounter (Signed)
Pt c/o medication issue:  1. Name of Medication: Atorvastatin  2. How are you currently taking this medication (dosage and times per day)? 1 time a day  3. Are you having a reaction (difficulty breathing--STAT)? no  4. What is your medication issue?  Back and side aching

## 2019-02-08 NOTE — Telephone Encounter (Signed)
Patient returning call. Gave me his work number for you to call.

## 2019-02-09 NOTE — Telephone Encounter (Signed)
Spoke with pt and went over recommendations.  Pt verbalized understanding and was in agreement with plan.  

## 2019-02-09 NOTE — Telephone Encounter (Signed)
Left message to call back  

## 2019-02-09 NOTE — Telephone Encounter (Signed)
Please have patient stop taking atorvastatin until his muscle aches resolve and then resume at 20mg  daily. He can cut his 40mg  tablets in half. If he still is having problems, let us know and we can try a different statin.

## 2019-02-15 ENCOUNTER — Other Ambulatory Visit: Payer: Medicare HMO | Admitting: *Deleted

## 2019-02-15 ENCOUNTER — Other Ambulatory Visit: Payer: Self-pay

## 2019-02-15 DIAGNOSIS — R748 Abnormal levels of other serum enzymes: Secondary | ICD-10-CM

## 2019-02-15 DIAGNOSIS — R7989 Other specified abnormal findings of blood chemistry: Secondary | ICD-10-CM

## 2019-02-15 LAB — BASIC METABOLIC PANEL
BUN/Creatinine Ratio: 10 (ref 10–24)
BUN: 15 mg/dL (ref 8–27)
CO2: 25 mmol/L (ref 20–29)
Calcium: 9.8 mg/dL (ref 8.6–10.2)
Chloride: 99 mmol/L (ref 96–106)
Creatinine, Ser: 1.46 mg/dL — ABNORMAL HIGH (ref 0.76–1.27)
GFR calc Af Amer: 57 mL/min/{1.73_m2} — ABNORMAL LOW (ref >59)
GFR calc non Af Amer: 49 mL/min/{1.73_m2} — ABNORMAL LOW (ref >59)
Glucose: 162 mg/dL — ABNORMAL HIGH (ref 65–99)
Potassium: 4.4 mmol/L (ref 3.5–5.2)
Sodium: 138 mmol/L (ref 134–144)

## 2019-02-15 LAB — HEPATIC FUNCTION PANEL
ALT: 83 IU/L — ABNORMAL HIGH (ref 0–44)
AST: 55 IU/L — ABNORMAL HIGH (ref 0–40)
Albumin: 4.6 g/dL (ref 3.8–4.8)
Alkaline Phosphatase: 125 IU/L — ABNORMAL HIGH (ref 39–117)
Bilirubin Total: 1.1 mg/dL (ref 0.0–1.2)
Bilirubin, Direct: 0.3 mg/dL (ref 0.00–0.40)
Total Protein: 6.7 g/dL (ref 6.0–8.5)

## 2019-02-16 ENCOUNTER — Telehealth: Payer: Self-pay | Admitting: *Deleted

## 2019-02-16 DIAGNOSIS — R748 Abnormal levels of other serum enzymes: Secondary | ICD-10-CM

## 2019-02-16 NOTE — Telephone Encounter (Signed)
Spoke with pt and went over results and recommendations.  Pt will come for labs on 12/11.  Pt verbalized understanding and was appreciative for call.

## 2019-02-16 NOTE — Telephone Encounter (Signed)
-----   Message from Belva Crome, MD sent at 02/15/2019  7:55 PM EST ----- Let the patient know kidney function slightly improved. Liver enzymes still elevated. Stop statin. Repeat liver panel 1 month.  A copy will be sent to Drosinis, Pamalee Leyden, PA-C

## 2019-02-27 ENCOUNTER — Telehealth: Payer: Self-pay | Admitting: Pharmacist

## 2019-02-27 NOTE — Telephone Encounter (Signed)
Called patient as a follow up to HTN visit last month. States his blood pressure has been running great. 110-117/ 77-84. Advised to continue losartan/ HCTZ 50-12.5mg  daily. Denies dizziness/lightheadedness Follow up only as needed in HTN clinic

## 2019-03-16 ENCOUNTER — Other Ambulatory Visit: Payer: Medicare HMO | Admitting: *Deleted

## 2019-03-16 ENCOUNTER — Other Ambulatory Visit: Payer: Self-pay

## 2019-03-16 DIAGNOSIS — R748 Abnormal levels of other serum enzymes: Secondary | ICD-10-CM

## 2019-03-16 LAB — HEPATIC FUNCTION PANEL
ALT: 70 IU/L — ABNORMAL HIGH (ref 0–44)
AST: 41 IU/L — ABNORMAL HIGH (ref 0–40)
Albumin: 4.3 g/dL (ref 3.8–4.8)
Alkaline Phosphatase: 173 IU/L — ABNORMAL HIGH (ref 39–117)
Bilirubin Total: 0.6 mg/dL (ref 0.0–1.2)
Bilirubin, Direct: 0.2 mg/dL (ref 0.00–0.40)
Total Protein: 6.6 g/dL (ref 6.0–8.5)

## 2019-03-19 NOTE — Progress Notes (Signed)
Opened in error

## 2019-05-03 ENCOUNTER — Telehealth: Payer: Self-pay

## 2019-05-03 ENCOUNTER — Telehealth: Payer: Self-pay | Admitting: Interventional Cardiology

## 2019-05-03 NOTE — Telephone Encounter (Signed)
I spoke to the patient who called because of hypotension.  I encouraged hydration and informed him that I will have the HTN clinic reach out to him, since there was a phone conversation between [facility] and the patient on 11/24.  He verbalized understanding.

## 2019-05-03 NOTE — Telephone Encounter (Signed)
lpmtcb 1/28 

## 2019-05-03 NOTE — Telephone Encounter (Signed)
Left message for patient to call back  

## 2019-05-03 NOTE — Telephone Encounter (Signed)
New Message    Pt c/o BP issue: STAT if pt c/o blurred vision, one-sided weakness or slurred speech  1. What are your last 5 BP readings? 100/61 103/62   2. Are you having any other symptoms (ex. Dizziness, headache, blurred vision, passed out)? No but some feeling of lethargic  3. What is your BP issue? Pt is calling and says his bp readings have been low and is a little worried   [place] call

## 2019-05-04 NOTE — Telephone Encounter (Signed)
Spoke with patient. States that he has been more tired and his blood pressures have been in the low 100's or 90's systolic. Advised that we could changed to just losartan 50mg  but patient believes he gets good benefit from HCTZ. They do not make a combo 25mg /12.5mg  therefore will just have patient take 1/2 of his 50/12.5mg  (25mg  losartan and 6.25mg  of HCTZ) and see how his blood pressure responds to this change. Patient will call back in a few weeks with his readings.

## 2019-05-04 NOTE — Addendum Note (Signed)
Addended by: Malena Peer D on: 05/04/2019 08:39 AM   Modules accepted: Orders

## 2019-06-04 ENCOUNTER — Telehealth: Payer: Self-pay | Admitting: Interventional Cardiology

## 2019-06-04 NOTE — Telephone Encounter (Signed)
Follow Up:   He said Dr Michaelle Copas nurse told him to call back this week, concerning his  Losartan.

## 2019-06-04 NOTE — Telephone Encounter (Signed)
Called and left VM with direct phone #

## 2019-06-04 NOTE — Telephone Encounter (Signed)
Previously spoke with patient about hypotension and his losartan/HCTZ was decreased to 1/2 tablet (25mg /6.25mg  daily) Spoke with patient who states that he changed diet- cut out sugar from diet a few weeks ago. States he hasnt taken his blood pressure medication in 1 week. The highest it has gone was 122/70. Other readings include 103/65, 108/69, 116/68.   I do not see any evidence of proteinuria. Therefore I advised the patient to continue to hold losartan/HCTZ. If his BP starts to run >130/80 again, we can consider starting just losartan 25mg  daily.

## 2019-06-04 NOTE — Telephone Encounter (Signed)
Pt called end of January with hypotension.  Spoke with [place], RPH.  Called pt and left message to call back. Will route to [facility] as well in the event call goes to her.

## 2019-06-06 NOTE — Telephone Encounter (Signed)
This is reasonable.   Doubt that he will be able to continue such a strict diet.

## 2019-08-13 NOTE — Progress Notes (Signed)
Cardiology Office Note:    Date:  08/14/2019   ID:  Timothy Rocha, DOB [birth year], MRN [id number]  PCP:  Drosinis, Pamalee Leyden, PA-C  Cardiologist:  Sinclair Grooms, MD   Referring MD: Drosinis, Pamalee Leyden, PA-C   Chief Complaint  Patient presents with  . Coronary Artery Disease    History of Present Illness:    Timothy Rocha is a 69 y.o. male with a hx of hypertension, hyperlipidemia, and DM II.Marland Kitchen  He is doing well.  No cardiac issues.  Does not take his medication every day.  Has a detailed log of blood pressures that he takes each morning.  Past Medical History:  Diagnosis Date  . Diabetes mellitus without complication ([place])   . Hyperlipemia   . Hypertension     Past Surgical History:  Procedure Laterality Date  . rotator cup       Current Medications: Current Meds  Medication Sig  . aspirin EC 81 MG tablet Take 81 mg by mouth.  . bimatoprost (LUMIGAN) 0.01 % SOLN Place 1 drop into both eyes at bedtime.  . Cholecalciferol (VITAMIN D) 2000 units tablet Take 2,000 Units by mouth daily.  . clobetasol cream (TEMOVATE) 2.37 % Apply 1 application topically 2 (two) times daily as needed (dermatitis).  Marland Kitchen desonide (DESOWEN) 0.05 % cream Apply 1 application topically 2 (two) times daily as needed (For beard).  . Dulaglutide (TRULICITY) 1.5 SE/8.3TD SOPN Inject 1.5 mg into the skin once a week.  . Eszopiclone 3 MG TABS Take 3 mg by mouth at bedtime. Take immediately before bedtime  . lansoprazole (PREVACID) 30 MG capsule Take 30 mg by mouth daily.  Marland Kitchen levocetirizine (XYZAL) 5 MG tablet Take 5 mg by mouth daily.  Marland Kitchen losartan-hydrochlorothiazide (HYZAAR) 50-12.5 MG tablet Take 1 tablet by mouth as needed.  . metformin (FORTAMET) 1000 MG (OSM) 24 hr tablet Take 1,000 mg by mouth daily.  Marland Kitchen OVER THE COUNTER MEDICATION Take 2 capsules by mouth at bedtime. ALTERIL NATURAL SLEEP AID  . Tadalafil 2.5 MG TABS Take 2.5 mg by mouth daily.  Marland Kitchen testosterone enanthate (DELATESTRYL) 200 MG/ML  injection Use as directed every two (2) weeks. For IM use only     Allergies:   Linagliptin-metformin hcl er, Olmesartan, Oxycodone, and Saxagliptin   Social History   Socioeconomic History  . Marital status: Married    Spouse name: Not on file  . Number of children: Not on file  . Years of education: Not on file  . Highest education level: Not on file  Occupational History  . Not on file  Tobacco Use  . Smoking status: Never Smoker  . Smokeless tobacco: Never Used  Substance and Sexual Activity  . Alcohol use: No  . Drug use: No  . Sexual activity: Not on file  Other Topics Concern  . Not on file  Social History Narrative  . Not on file   Social Determinants of Health   Financial Resource Strain:   . Difficulty of Paying Living Expenses:   Food Insecurity:   . Worried About Charity fundraiser in the Last Year:   . Arboriculturist in the Last Year:   Transportation Needs:   . Film/video editor (Medical):   Marland Kitchen Lack of Transportation (Non-Medical):   Physical Activity:   . Days of Exercise per Week:   . Minutes of Exercise per Session:   Stress:   . Feeling of Stress :   Social Connections:   .  Frequency of Communication with Friends and Family:   . Frequency of Social Gatherings with Friends and Family:   . Attends Religious Services:   . Active Member of Clubs or Organizations:   . Attends Banker Meetings:   Marland Kitchen Marital Status:      Family History: The patient's family history is not on file.  ROS:   Please see the history of present illness.    Insomnia otherwise no complaints all other systems reviewed and are negative.  EKGs/Labs/Other Studies Reviewed:    The following studies were reviewed today: No new data  EKG:  EKG none  Recent Labs: 02/15/2019: BUN 15; Creatinine, Ser 1.46; Potassium 4.4; Sodium 138 03/16/2019: ALT 70  Recent Lipid Panel    Component Value Date/Time   CHOL 120 01/26/2019 1514   TRIG 296 (H) 01/26/2019  1514   HDL 35 (L) 01/26/2019 1514   CHOLHDL 3.4 01/26/2019 1514   LDLCALC 40 01/26/2019 1514    Physical Exam:    VS:  BP 134/80   Pulse 87   Ht 5\' 10"  (1.778 m)   Wt 216 lb 3.2 oz (98.1 kg)   SpO2 97%   BMI 31.02 kg/m     Wt Readings from Last 3 Encounters:  08/14/19 216 lb 3.2 oz (98.1 kg)  01/09/19 221 lb 3.2 oz (100.3 kg)  09/29/18 210 lb (95.3 kg)     GEN: Slightly over weight.. No acute distress HEENT: Normal NECK: No JVD. LYMPHATICS: No lymphadenopathy CARDIAC:  RRR without murmur, gallop, or edema. VASCULAR:  Normal Pulses. No bruits. RESPIRATORY:  Clear to auscultation without rales, wheezing or rhonchi  ABDOMEN: Soft, non-tender, non-distended, No pulsatile mass, MUSCULOSKELETAL: No deformity  SKIN: Warm and dry NEUROLOGIC:  Alert and oriented x 3 PSYCHIATRIC:  Normal affect   ASSESSMENT:    1. Essential hypertension   2. Other hyperlipidemia   3. Controlled type 2 diabetes mellitus without complication, without long-term current use of insulin (HCC)   4. Educated about COVID-19 virus infection    PLAN:    In order of problems listed above:  1. Good blood pressure control.  Low-salt diet and weight loss.  Exercise also encouraged. 2. LDL target less than 70.  Most recent total cholesterol is 120 in October.  LDL is not available. 3. Hemoglobin A1c target less than 7. 4. Vaccine and social distancing are being used.  Overall education and awareness concerning primar risk prevention was discussed in detail: LDL less than 70, hemoglobin A1c less than 7, blood pressure target less than 130/80 mmHg, >150 minutes of moderate aerobic activity per week, avoidance of smoking, weight control (via diet and exercise), and continued surveillance/management of/for obstructive sleep apnea.    Medication Adjustments/Labs and Tests Ordered: Current medicines are reviewed at length with the patient today.  Concerns regarding medicines are outlined above.  No orders of  the defined types were placed in this encounter.  No orders of the defined types were placed in this encounter.   Patient Instructions  Medication Instructions:  Your physician recommends that you continue on your current medications as directed. Please refer to the Current Medication list given to you today.  *If you need a refill on your cardiac medications before your next appointment, please call your pharmacy*   Lab Work: None If you have labs (blood work) drawn today and your tests are completely normal, you will receive your results only by: November MyChart Message (if you have MyChart) OR . A  paper copy in the mail If you have any lab test that is abnormal or we need to change your treatment, we will call you to review the results.   Testing/Procedures: None   Follow-Up: At [facility], you and your health needs are our priority.  As part of our continuing mission to provide you with exceptional heart care, we have created designated Provider Care Teams.  These Care Teams include your primary Cardiologist (physician) and Advanced Practice Providers (APPs -  Physician Assistants and Nurse Practitioners) who all work together to provide you with the care you need, when you need it.  We recommend signing up for the patient portal called "MyChart".  Sign up information is provided on this After Visit Summary.  MyChart is used to connect with patients for Virtual Visits (Telemedicine).  Patients are able to view lab/test results, encounter notes, upcoming appointments, etc.  Non-urgent messages can be sent to your provider as well.   To learn more about what you can do with MyChart, go to [email or web address].    Your next appointment:   12 month(s)  The format for your next appointment:   In Person  Provider:   You may see Lesleigh Noe, MD or one of the following Advanced Practice Providers on your designated Care Team:    Norma Fredrickson, NP  Nada Boozer, NP  Georgie Chard, NP    Other Instructions      Signed, Lesleigh Noe, MD  08/14/2019 10:20 AM    [facility]

## 2019-08-14 ENCOUNTER — Encounter: Payer: Self-pay | Admitting: Interventional Cardiology

## 2019-08-14 ENCOUNTER — Ambulatory Visit: Payer: Medicare HMO | Admitting: Interventional Cardiology

## 2019-08-14 ENCOUNTER — Other Ambulatory Visit: Payer: Self-pay

## 2019-08-14 VITALS — BP 134/80 | HR 87 | Ht 70.0 in | Wt 216.2 lb

## 2019-08-14 DIAGNOSIS — Z7189 Other specified counseling: Secondary | ICD-10-CM

## 2019-08-14 DIAGNOSIS — E119 Type 2 diabetes mellitus without complications: Secondary | ICD-10-CM

## 2019-08-14 DIAGNOSIS — E7849 Other hyperlipidemia: Secondary | ICD-10-CM | POA: Diagnosis not present

## 2019-08-14 DIAGNOSIS — I10 Essential (primary) hypertension: Secondary | ICD-10-CM | POA: Diagnosis not present

## 2019-08-14 NOTE — Patient Instructions (Signed)

## 2019-10-02 ENCOUNTER — Telehealth: Payer: Self-pay | Admitting: Interventional Cardiology

## 2019-10-02 MED ORDER — LOSARTAN POTASSIUM-HCTZ 50-12.5 MG PO TABS: 1.0000 | ORAL_TABLET | ORAL | 1 refills | Status: DC | PRN

## 2019-10-02 NOTE — Telephone Encounter (Signed)
Transferred call to Constance Holster

## 2019-10-02 NOTE — Telephone Encounter (Signed)
Left message to call back  

## 2019-10-02 NOTE — Telephone Encounter (Signed)
Spoke with pt and advised I will send over a new prescription.  Not sure why pharmacy thinks it was cancelled.  Pt appreciative for call.

## 2019-10-02 NOTE — Telephone Encounter (Signed)
New Message:    Pt said the pharmacist said his Losartan prescription have been cancelled. Pt wanted to know why it was cancelled.

## 2020-03-27 ENCOUNTER — Other Ambulatory Visit: Payer: Self-pay | Admitting: Interventional Cardiology

## 2020-09-29 ENCOUNTER — Other Ambulatory Visit: Payer: Self-pay | Admitting: Interventional Cardiology

## 2020-11-18 NOTE — Progress Notes (Signed)
Cardiology Office Note:    Date:  11/20/2020   ID:  Timothy Rocha, DOB [birth year], MRN [id number]  PCP:  Shellia Cleverly, PA  Cardiologist:  Lesleigh Noe, MD   Referring MD: Drosinis, Leonia Reader, PA-C   No chief complaint on file.   History of Present Illness:    Timothy Rocha is a 70 y.o. male with a hx of hypertension, hyperlipidemia, and DM II.   He follows for risk modification.  He has hypertension.  He brings in his blood pressure log and most are under 140/80 mmHg.  He has a good number of blood pressures that are less than 120/70 mmHg.  Because of this he adjusts how much of the losartan HCT that he takes.  He frequently only takes 1/2 tablet.  If he takes a whole tablet he will feel weak and lightheaded.  He remains very active.  He plays golf frequently.  He has more than 10,000 steps per day.  He has not had syncope.  No chest pain.  No orthopnea PND.  Does not sleep well.  Does not feel he has sleep apnea.  States his wife tells him he does not snore.  He does have occasional excessive daytime sleepiness.  Past Medical History:  Diagnosis Date   Diabetes mellitus without complication (HCC)    Hyperlipemia    Hypertension     Past Surgical History:  Procedure Laterality Date   rotator cup       Current Medications: Current Meds  Medication Sig   aspirin EC 81 MG tablet Take 81 mg by mouth.   bimatoprost (LUMIGAN) 0.01 % SOLN Place 1 drop into both eyes at bedtime.   Cholecalciferol (VITAMIN D) 2000 units tablet Take 2,000 Units by mouth daily.   clobetasol cream (TEMOVATE) 0.05 % Apply 1 application topically 2 (two) times daily as needed (dermatitis).   desonide (DESOWEN) 0.05 % cream Apply 1 application topically 2 (two) times daily as needed (For beard).   Dulaglutide 1.5 MG/0.5ML SOPN Inject 1.5 mg into the skin once a week.   Eszopiclone 3 MG TABS Take 3 mg by mouth at bedtime. Take immediately before bedtime   ketoconazole (NIZORAL) 2 % cream SMARTSIG:1  Topical Daily PRN   ketoconazole (NIZORAL) 2 % shampoo See admin instructions.   lansoprazole (PREVACID) 30 MG capsule Take 30 mg by mouth daily.   levocetirizine (XYZAL) 5 MG tablet Take 5 mg by mouth daily.   losartan-hydrochlorothiazide (HYZAAR) 50-12.5 MG tablet TAKE 1 TABLET BY MOUTH EVERY DAY AS NEEDED   metformin (FORTAMET) 1000 MG (OSM) 24 hr tablet Take 1,000 mg by mouth daily.   ONETOUCH VERIO test strip 2 (two) times daily.   OVER THE COUNTER MEDICATION Take 2 capsules by mouth at bedtime. ALTERIL NATURAL SLEEP AID   tadalafil (CIALIS) 5 MG tablet Take 5 mg by mouth 2 (two) times a week.   Tadalafil 2.5 MG TABS Take 2.5 mg by mouth daily.   testosterone cypionate (DEPOTESTOSTERONE CYPIONATE) 200 MG/ML injection Inject 200 mg into the muscle every 14 (fourteen) days.   testosterone enanthate (DELATESTRYL) 200 MG/ML injection Use as directed every two (2) weeks. For IM use only     Allergies:   Linagliptin-metformin hcl er, Olmesartan, Oxycodone, Saxagliptin, and Tramadol   Social History   Socioeconomic History   Marital status: Married    Spouse name: Not on file   Number of children: Not on file   Years of education: Not on  file   Highest education level: Not on file  Occupational History   Not on file  Tobacco Use   Smoking status: Never   Smokeless tobacco: Never  Substance and Sexual Activity   Alcohol use: No   Drug use: No   Sexual activity: Not on file  Other Topics Concern   Not on file  Social History Narrative   Not on file   Social Determinants of Health   Financial Resource Strain: Not on file  Food Insecurity: Not on file  Transportation Needs: Not on file  Physical Activity: Not on file  Stress: Not on file  Social Connections: Not on file     Family History: The patient's family history is not on file.  ROS:   Please see the history of present illness.    A1c is not well controlled.  Additional medication is being taken.  He denies  claudication.  He walks frequently without leg cramping or discomfort.  Denies orthopnea and PND.  All other systems reviewed and are negative.  EKGs/Labs/Other Studies Reviewed:    The following studies were reviewed today:  Cardiac imaging or testing.  EKG:  EKG normal sinus rhythm, nonspecific T wave abnormality, with biatrial abnormality.  Prominent voltage.  When compared to October 2020, T wave abnormality is new.  Recent Labs: No results found for requested labs within last 8760 hours.  Recent Lipid Panel    Component Value Date/Time   CHOL 120 01/26/2019 1514   TRIG 296 (H) 01/26/2019 1514   HDL 35 (L) 01/26/2019 1514   CHOLHDL 3.4 01/26/2019 1514   LDLCALC 40 01/26/2019 1514    Physical Exam:    VS:  BP (!) 142/98   Pulse 79   Ht 5\' 10"  (1.778 m)   Wt 208 lb (94.3 kg)   SpO2 98%   BMI 29.84 kg/m     Wt Readings from Last 3 Encounters:  11/20/20 208 lb (94.3 kg)  08/14/19 216 lb 3.2 oz (98.1 kg)  01/09/19 221 lb 3.2 oz (100.3 kg)     GEN: Compatible with age. No acute distress HEENT: Normal NECK: No JVD. LYMPHATICS: No lymphadenopathy CARDIAC: No murmur. RRR S4 gallop, but no edema. VASCULAR:  Normal Pulses. No bruits. RESPIRATORY:  Clear to auscultation without rales, wheezing or rhonchi  ABDOMEN: Soft, non-tender, non-distended, No pulsatile mass, MUSCULOSKELETAL: No deformity  SKIN: Warm and dry NEUROLOGIC:  Alert and oriented x 3 PSYCHIATRIC:  Normal affect   ASSESSMENT:    1. Essential hypertension   2. Other hyperlipidemia   3. Controlled type 2 diabetes mellitus without complication, without long-term current use of insulin (HCC)    PLAN:    In order of problems listed above:  Not well controlled today.  Home blood pressure recordings suggest otherwise.  Plan exercise treadmill test to monitor blood pressure response to physical activity.  He wants to have his dose of losartan HCT permanently decreased.  I do think that would be a good idea  but hopefully the treadmill test will help 03/11/19 to sort it out. Was 53 when last checked.  Triglyceride was 164 in February 2022. Last hemoglobin A1c was 8.1.  Continue Fortamet and dulaglutide  Exercise treadmill test with further blood pressure management instructions based upon response.  He will be exercised on medications.   Medication Adjustments/Labs and Tests Ordered: Current medicines are reviewed at length with the patient today.  Concerns regarding medicines are outlined above.  Orders Placed This Encounter  Procedures  Exercise Tolerance Test   EKG 12-Lead   No orders of the defined types were placed in this encounter.   Patient Instructions  Medication Instructions:  Your physician recommends that you continue on your current medications as directed. Please refer to the Current Medication list given to you today.  *If you need a refill on your cardiac medications before your next appointment, please call your pharmacy*   Lab Work: None If you have labs (blood work) drawn today and your tests are completely normal, you will receive your results only by: MyChart Message (if you have MyChart) OR A paper copy in the mail If you have any lab test that is abnormal or we need to change your treatment, we will call you to review the results.   Testing/Procedures: Your physician has requested that you have an exercise tolerance test. For further information please visit [email or web address] Please also follow instruction sheet, as given.   Follow-Up: At [facility], you and your health needs are our priority.  As part of our continuing mission to provide you with exceptional heart care, we have created designated Provider Care Teams.  These Care Teams include your primary Cardiologist (physician) and Advanced Practice Providers (APPs -  Physician Assistants and Nurse Practitioners) who all work together to provide you with the care you need, when you need it.  We  recommend signing up for the patient portal called "MyChart".  Sign up information is provided on this After Visit Summary.  MyChart is used to connect with patients for Virtual Visits (Telemedicine).  Patients are able to view lab/test results, encounter notes, upcoming appointments, etc.  Non-urgent messages can be sent to your provider as well.   To learn more about what you can do with MyChart, go to [email or web address].    Your next appointment:   1 year(s)  The format for your next appointment:   In Person  Provider:   You may see Lesleigh Noe, MD or one of the following Advanced Practice Providers on your designated Care Team:   Nada Boozer, NP   Other Instructions     Signed, Lesleigh Noe, MD  11/20/2020 4:56 PM    [facility]

## 2020-11-20 ENCOUNTER — Ambulatory Visit: Payer: Medicare HMO | Admitting: Interventional Cardiology

## 2020-11-20 ENCOUNTER — Encounter: Payer: Self-pay | Admitting: Interventional Cardiology

## 2020-11-20 ENCOUNTER — Other Ambulatory Visit: Payer: Self-pay

## 2020-11-20 VITALS — BP 142/98 | HR 79 | Ht 70.0 in | Wt 208.0 lb

## 2020-11-20 DIAGNOSIS — E119 Type 2 diabetes mellitus without complications: Secondary | ICD-10-CM | POA: Diagnosis not present

## 2020-11-20 DIAGNOSIS — I10 Essential (primary) hypertension: Secondary | ICD-10-CM | POA: Diagnosis not present

## 2020-11-20 DIAGNOSIS — E7849 Other hyperlipidemia: Secondary | ICD-10-CM

## 2020-11-20 NOTE — Patient Instructions (Signed)
Medication Instructions:  Your physician recommends that you continue on your current medications as directed. Please refer to the Current Medication list given to you today.  *If you need a refill on your cardiac medications before your next appointment, please call your pharmacy*   Lab Work: None If you have labs (blood work) drawn today and your tests are completely normal, you will receive your results only by: MyChart Message (if you have MyChart) OR A paper copy in the mail If you have any lab test that is abnormal or we need to change your treatment, we will call you to review the results.   Testing/Procedures: Your physician has requested that you have an exercise tolerance test. For further information please visit [email or web address] Please also follow instruction sheet, as given.   Follow-Up: At [facility], you and your health needs are our priority.  As part of our continuing mission to provide you with exceptional heart care, we have created designated Provider Care Teams.  These Care Teams include your primary Cardiologist (physician) and Advanced Practice Providers (APPs -  Physician Assistants and Nurse Practitioners) who all work together to provide you with the care you need, when you need it.  We recommend signing up for the patient portal called "MyChart".  Sign up information is provided on this After Visit Summary.  MyChart is used to connect with patients for Virtual Visits (Telemedicine).  Patients are able to view lab/test results, encounter notes, upcoming appointments, etc.  Non-urgent messages can be sent to your provider as well.   To learn more about what you can do with MyChart, go to [email or web address].    Your next appointment:   1 year(s)  The format for your next appointment:   In Person  Provider:   You may see Lesleigh Noe, MD or one of the following Advanced Practice Providers on your designated Care Team:   Nada Boozer,  NP   Other Instructions

## 2020-11-27 ENCOUNTER — Other Ambulatory Visit: Payer: Self-pay

## 2020-11-27 ENCOUNTER — Ambulatory Visit (INDEPENDENT_AMBULATORY_CARE_PROVIDER_SITE_OTHER): Payer: Medicare HMO

## 2020-11-27 DIAGNOSIS — I10 Essential (primary) hypertension: Secondary | ICD-10-CM | POA: Diagnosis not present

## 2020-11-27 LAB — EXERCISE TOLERANCE TEST
Angina Index: 0
Base ST Depression (mm): 0 mm
Duke Treadmill Score: 8
Estimated workload: 10.1
Exercise duration (min): 8 min
Exercise duration (sec): 0 s
MPHR: 151 {beats}/min
Peak HR: 139 {beats}/min
Percent HR: 92 %
RPE: 16
Rest HR: 83 {beats}/min
ST Depression (mm): 0 mm

## 2020-12-01 ENCOUNTER — Other Ambulatory Visit: Payer: Self-pay | Admitting: *Deleted

## 2020-12-01 MED ORDER — LOSARTAN POTASSIUM 25 MG PO TABS: 25.0000 mg | ORAL_TABLET | Freq: Every day | ORAL | 3 refills | Status: DC

## 2020-12-09 ENCOUNTER — Telehealth: Payer: Self-pay | Admitting: Interventional Cardiology

## 2020-12-09 NOTE — Telephone Encounter (Signed)
Called pt back in regards to high BP.  He reports that his BP are reading high since med (12/01/20) change from losartan- HCTZ 50-12.5 mg PO QD to losartan 25 mg PO QD.  He reports the following BP with only losartan 25 mg: 9/1-128/86 9/2-130/85 9/3-143/91 He started taking losartan 25 mg along with 12.5 mg of HCTZ on 9/4 d/t high BP.  BP readings are as follow with this med change:  9/4-141/85 9/5-139/93 9/6-135/89 Pt would like to know if he should continue to take the losartan 25 mg and HCTZ 12.5.  I advised pt that while BP is elevated it is not at a dangerous level. He reports that he has not eaten more salt than normal.  He did report eating a hot dog and fries over the weekend; other than that he has watched how much salt he consumes.   I advised I would send message to MD to address.  If he has any further concerns he can call back in.

## 2020-12-09 NOTE — Telephone Encounter (Signed)
Pt c/o medication issue:  1. Name of Medication: Losartan  2. How are you currently taking this medication (dosage and times per day)? 1 time a day   3. Are you having a reaction (difficulty breathing--STAT)? no  4. What is your medication issue? The medicine is not keeping his pressure down

## 2020-12-10 NOTE — Telephone Encounter (Signed)
Stay on Losartan 25 mmg daily. Hctz 12.5 mg daily BP < 140/80 mmHg is upper limit. Target BP 130/80 mmHg.

## 2020-12-11 NOTE — Telephone Encounter (Signed)
Spoke with pt and made him aware of information from Dr. Smith.  Pt appreciative for call.  

## 2020-12-27 ENCOUNTER — Other Ambulatory Visit: Payer: Self-pay | Admitting: Interventional Cardiology

## 2021-11-05 ENCOUNTER — Telehealth: Payer: Self-pay | Admitting: Interventional Cardiology

## 2021-11-05 DIAGNOSIS — Z79899 Other long term (current) drug therapy: Secondary | ICD-10-CM

## 2021-11-05 DIAGNOSIS — I10 Essential (primary) hypertension: Secondary | ICD-10-CM

## 2021-11-05 MED ORDER — LOSARTAN POTASSIUM 25 MG PO TABS: 25.0000 mg | ORAL_TABLET | Freq: Every day | ORAL | 0 refills | Status: DC

## 2021-11-05 NOTE — Telephone Encounter (Signed)
 *  STAT* If patient is at the pharmacy, call can be transferred to refill team.   1. Which medications need to be refilled? (please list name of each medication and dose if known) losartan (COZAAR) 25 MG tablet  2. Which pharmacy/location (including street and city if local pharmacy) is medication to be sent to?[id number] - [place], [place] - [address]  3. Do they need a 30 day or 90 day supply? 90 days

## 2021-11-05 NOTE — Telephone Encounter (Signed)
  Pt c/o medication issue:  1. Name of Medication: hydrochlorothiazide  2. How are you currently taking this medication (dosage and times per day)?   3. Are you having a reaction (difficulty breathing--STAT)?   4. What is your medication issue? Pt needs a refill of this meds but its not on his medication list

## 2021-11-05 NOTE — Telephone Encounter (Signed)
Pt's medication was sent to pt's pharmacy as requested. Confirmation received.  °

## 2021-11-05 NOTE — Telephone Encounter (Signed)
Returned call to patient.  Patient states he has been taking his wife's prescription of HCTZ 12.5mg  as needed in addition to his Losartan 25mg  daily. Patient states his combo medication of Losartan/HCTZ was discontinued because it made his BP too low.  Patient states his wife is out of HCTZ and would like a refill.  Patient does not have HCTZ 12.5mg  on his medication list. Will forward to Dr. to review and advise on if he would like to order this medication for patient.  [facility] [place] [place]

## 2021-11-06 MED ORDER — HYDROCHLOROTHIAZIDE 12.5 MG PO TABS: 12.5000 mg | ORAL_TABLET | Freq: Every day | ORAL | 3 refills | Status: DC

## 2021-11-06 NOTE — Telephone Encounter (Signed)
Spoke with patient and discussed Dr. Michaelle Copas recommendation:  Prescribe Hctz 12.5 mg daily for the patient along with Losartan 25 mg daily. BMET in 3 weeks.   Patient states he does not take HCTZ on days when his BP is 110/60 or less because it will drop BP too low.  Patient states he has taken HCTZ 12 of the last 17 days.  Advised patient to continue monitoring his BP prior to taking BP meds and may hold HCTZ if BP is <110/60 as he has been doing.  Lab appt scheduled for 11/27/21.

## 2021-11-27 ENCOUNTER — Other Ambulatory Visit: Payer: Medicare HMO

## 2021-11-27 DIAGNOSIS — I10 Essential (primary) hypertension: Secondary | ICD-10-CM

## 2021-11-27 DIAGNOSIS — Z79899 Other long term (current) drug therapy: Secondary | ICD-10-CM

## 2021-11-27 LAB — BASIC METABOLIC PANEL
BUN/Creatinine Ratio: 14 (ref 10–24)
BUN: 20 mg/dL (ref 8–27)
CO2: 23 mmol/L (ref 20–29)
Calcium: 9.6 mg/dL (ref 8.6–10.2)
Chloride: 101 mmol/L (ref 96–106)
Creatinine, Ser: 1.44 mg/dL — ABNORMAL HIGH (ref 0.76–1.27)
Glucose: 156 mg/dL — ABNORMAL HIGH (ref 70–99)
Potassium: 4.8 mmol/L (ref 3.5–5.2)
Sodium: 140 mmol/L (ref 134–144)
eGFR: 52 mL/min/{1.73_m2} — ABNORMAL LOW (ref >59)

## 2022-01-11 ENCOUNTER — Ambulatory Visit: Payer: Medicare HMO | Admitting: Interventional Cardiology

## 2022-01-16 NOTE — Progress Notes (Unsigned)
Cardiology Office Note:    Date:  01/16/2022   ID:  Timothy Rocha, DOB 14-Jul-1950, MRN [id number]  PCP:  Manfred Shirts, PA  Cardiologist:  Sinclair Grooms, MD   Referring MD: Manfred Shirts, [place]   No chief complaint on file.   History of Present Illness:    Timothy Rocha is a 71 y.o. male with a hx of primary hypertension, hyperlipidemia, and DM II.  He is doing well and has no complaints.  He does note that if he takes HCTZ every day, he will have orthostatic dizziness.  He takes losartan 25 mg daily and 2-3 times per week he will use HCTZ.  No chest pain.  Past Medical History:  Diagnosis Date   Diabetes mellitus without complication ([place])    Hyperlipemia    Hypertension     Past Surgical History:  Procedure Laterality Date   rotator cup       Current Medications: No outpatient medications have been marked as taking for the 01/18/22 encounter (Appointment) with Belva Crome, MD.     Allergies:   Linagliptin-metformin hcl er, Olmesartan, Oxycodone, Saxagliptin, and Tramadol   Social History   Socioeconomic History   Marital status: Married    Spouse name: Not on file   Number of children: Not on file   Years of education: Not on file   Highest education level: Not on file  Occupational History   Not on file  Tobacco Use   Smoking status: Never   Smokeless tobacco: Never  Substance and Sexual Activity   Alcohol use: No   Drug use: No   Sexual activity: Not on file  Other Topics Concern   Not on file  Social History Narrative   Not on file   Social Determinants of Health   Financial Resource Strain: Not on file  Food Insecurity: Not on file  Transportation Needs: Not on file  Physical Activity: Not on file  Stress: Not on file  Social Connections: Not on file     Family History: The patient's family history is not on file.  ROS:   Please see the history of present illness.    No complaints other than difficulty with sleep.  Recently  diagnosed with sleep apnea.  All other systems reviewed and are negative.  EKGs/Labs/Other Studies Reviewed:    The following studies were reviewed today: ETT Jul 21, 2020: Stress Findings  Resting ECG ECG is normal. Resting ECG shows no ST-segment deviation.  Stress Findings A Bruce protocol stress test was performed. Exercise capacity was normal. Stage 3 was reached after exercising for 8 min and 0 sec. Maximum HR of 139 bpm. MPHR 92.0 %. Peak METS 10.1 .  The patient experienced no angina during the test. The patient requested the test to be stopped. The patient reported no symptoms during the stress test. Hypertensive blood pressure and normal heart rate response noted during stress. Heart rate recovery was normal.  Stress ECG No ST deviation was noted. Arrhythmias during stress: occasional PACs. Arrhythmias during recovery: occasional PACs, rare PVCs.     Hypertension at rest and with exercise. Otherwise normal ECG stress test  EKG:  EKG normal sinus rhythm, interventricular conduction delay, PVC, prominent voltage.  Recent Labs: 11/27/2021: BUN 20; Creatinine, Ser 1.44; Potassium 4.8; Sodium 140  Recent Lipid Panel    Component Value Date/Time   CHOL 120 01/26/2019 1514   TRIG 296 (H) 01/26/2019 1514   HDL 35 (L) 01/26/2019 1514  CHOLHDL 3.4 01/26/2019 1514   LDLCALC 40 01/26/2019 1514    Physical Exam:    VS:  There were no vitals taken for this visit.    Wt Readings from Last 3 Encounters:  11/20/20 208 lb (94.3 kg)  08/14/19 216 lb 3.2 oz (98.1 kg)  01/09/19 221 lb 3.2 oz (100.3 kg)     GEN: Healthy appearing. No acute distress HEENT: Normal NECK: No JVD. LYMPHATICS: No lymphadenopathy CARDIAC: No murmur. RRR no gallop, or edema. VASCULAR:  Normal Pulses. No bruits. RESPIRATORY:  Clear to auscultation without rales, wheezing or rhonchi  ABDOMEN: Soft, non-tender, non-distended, No pulsatile mass, MUSCULOSKELETAL: No deformity  SKIN: Warm and dry NEUROLOGIC:  Alert  and oriented x 3 PSYCHIATRIC:  Normal affect   ASSESSMENT:    1. Essential hypertension   2. Other hyperlipidemia   3. Controlled type 2 diabetes mellitus without complication, without long-term current use of insulin (HCC)    PLAN:    In order of problems listed above:  Excellent control Monitor lipids.  May need statin. Will be starting Mounjaro.  Recommended baby aspirin 81 mg 3 times per week.  Overall education and awareness concerning primary risk prevention was discussed in detail: LDL less than 70, hemoglobin A1c less than 7, blood pressure target less than 130/80 mmHg, >150 minutes of moderate aerobic activity per week, avoidance of smoking, weight control (via diet and exercise), and continued surveillance/management of/for obstructive sleep apnea.    Medication Adjustments/Labs and Tests Ordered: Current medicines are reviewed at length with the patient today.  Concerns regarding medicines are outlined above.  No orders of the defined types were placed in this encounter.  No orders of the defined types were placed in this encounter.   There are no Patient Instructions on file for this visit.   Signed, Lesleigh Noe, MD  01/16/2022 2:46 PM    [facility]

## 2022-01-18 ENCOUNTER — Ambulatory Visit: Payer: Medicare HMO | Attending: Interventional Cardiology | Admitting: Interventional Cardiology

## 2022-01-18 ENCOUNTER — Encounter: Payer: Self-pay | Admitting: Interventional Cardiology

## 2022-01-18 VITALS — BP 118/82 | HR 85 | Ht 70.0 in | Wt 209.4 lb

## 2022-01-18 DIAGNOSIS — E7849 Other hyperlipidemia: Secondary | ICD-10-CM

## 2022-01-18 DIAGNOSIS — E119 Type 2 diabetes mellitus without complications: Secondary | ICD-10-CM

## 2022-01-18 DIAGNOSIS — I10 Essential (primary) hypertension: Secondary | ICD-10-CM

## 2022-01-18 NOTE — Patient Instructions (Signed)
Medication Instructions:  Your physician recommends that you continue on your current medications as directed. Please refer to the Current Medication list given to you today.  *If you need a refill on your cardiac medications before your next appointment, please call your pharmacy*  Lab Work: NONE  Testing/Procedures: NONE  Follow-Up: At [facility], you and your health needs are our priority.  As part of our continuing mission to provide you with exceptional heart care, we have created designated Provider Care Teams.  These Care Teams include your primary Cardiologist (physician) and Advanced Practice Providers (APPs -  Physician Assistants and Nurse Practitioners) who all work together to provide you with the care you need, when you need it.  Your next appointment:   1 year(s)  The format for your next appointment:   In Person  Provider:   Sinclair Grooms, MD (May ask for Candee Furbish, MD)  Important Information About Sugar

## 2022-02-04 ENCOUNTER — Other Ambulatory Visit: Payer: Self-pay | Admitting: Interventional Cardiology

## 2022-11-10 ENCOUNTER — Other Ambulatory Visit (HOSPITAL_BASED_OUTPATIENT_CLINIC_OR_DEPARTMENT_OTHER): Payer: Self-pay | Admitting: *Deleted

## 2023-01-21 ENCOUNTER — Other Ambulatory Visit: Payer: Self-pay

## 2023-01-21 MED ORDER — LOSARTAN POTASSIUM 25 MG PO TABS: 25.0000 mg | ORAL_TABLET | Freq: Every day | ORAL | 0 refills | Status: DC

## 2023-03-31 ENCOUNTER — Ambulatory Visit: Payer: Medicare HMO | Attending: Cardiology | Admitting: Cardiology

## 2023-03-31 ENCOUNTER — Encounter: Payer: Self-pay | Admitting: Cardiology

## 2023-04-22 ENCOUNTER — Other Ambulatory Visit: Payer: Self-pay

## 2023-04-22 MED ORDER — HYDROCHLOROTHIAZIDE 12.5 MG PO TABS: 12.5000 mg | ORAL_TABLET | Freq: Every day | ORAL | 0 refills | Status: DC

## 2023-04-27 ENCOUNTER — Ambulatory Visit: Payer: Medicare HMO | Attending: Cardiology | Admitting: Cardiology

## 2023-04-27 ENCOUNTER — Encounter: Payer: Self-pay | Admitting: Cardiology

## 2023-04-27 VITALS — BP 122/84 | HR 79 | Ht 70.0 in | Wt 196.4 lb

## 2023-04-27 DIAGNOSIS — E7849 Other hyperlipidemia: Secondary | ICD-10-CM | POA: Diagnosis not present

## 2023-04-27 DIAGNOSIS — I10 Essential (primary) hypertension: Secondary | ICD-10-CM | POA: Diagnosis not present

## 2023-04-27 DIAGNOSIS — E119 Type 2 diabetes mellitus without complications: Secondary | ICD-10-CM | POA: Diagnosis not present

## 2023-04-27 NOTE — Progress Notes (Signed)
Cardiology Office Note:  .   Date:  04/27/2023  ID:  Timothy Rocha, DOB 02/03/1951, MRN [id number] PCP: Shellia Cleverly, PA  [facility] Providers Cardiologist:  Donato Schultz, MD    History of Present Illness: .   Timothy Rocha is a 73 y.o. male Discussed the use of AI scribe software for clinical note transcription with the patient, who gave verbal consent to proceed.  History of Present Illness   The patient, a 73 year old with a history of hypertension, hyperlipidemia, and type 2 diabetes, presents for a follow-up visit. He has previously experienced intolerances to olmesartan and has a history of occasional PACs and PVCs as revealed by an exercise treadmill test in 2022. The patient achieved 10.1 METs in 8 minutes with no angina, indicating a low-risk exercise treadmill test. Prior EKG showed sinus rhythm with interventricular conduction delay, PBC, and prominent voltage. The patient also has mild chronic kidney disease, as indicated by a creatinine level of 1.44.  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 He receives weekly testosterone injections for hypogonadism and uses a CPAP device for obstructive sleep apnea and insomnia, managed by neurology. Despite testosterone injections, the patient reports chronic fatigue.  The patient has previously tried statins but experienced myalgias and elevated LFTs. He is currently on a low dose of atorvastatin (10mg ) as prescribed by his primary care physician, primarily for cardiovascular protection in the context of diabetes.  The patient remains active, playing tennis and walking regularly. He has recently lost weight, which he attributes to his medication, Mounjaro.  The patient's blood pressure is managed with hydrochlorothiazide and losartan.           Studies Reviewed: Marland Kitchen   EKG Interpretation Date/Time:  Wednesday April 27 2023 10:12:51  EST Ventricular Rate:  79 PR Interval:  200 QRS Duration:  86 QT Interval:  352 QTC Calculation: 403 R Axis:   42  Text Interpretation: Normal sinus rhythm Normal ECG When compared with ECG of 30-Oct-2012 22:19, No significant change was found Confirmed by Donato Schultz ([id number]) on 04/27/2023 10:16:52 AM    Results   LABS Creatinine: 1.44 mg/dL Sodium: 191 mmol/L Potassium: 4.8 mmol/L LDL: 70 mg/dL Triglycerides: 478 mg/dL  DIAGNOSTIC Exercise treadmill: occasional PACs and PVCs, 8 minutes, 10.1 METs, no angina, low risk (2022) EKG: sinus rhythm with interventricular conduction delay, PVC, prominent voltage     Risk Assessment/Calculations:            Physical Exam:   VS:  BP 122/84   Pulse 79   Ht 5\' 10"  (1.778 m)   Wt 196 lb 6.4 oz (89.1 kg)   SpO2 97%   BMI 28.18 kg/m    Wt Readings from Last 3 Encounters:  04/27/23 196 lb 6.4 oz (89.1 kg)  01/18/22 209 lb 6.4 oz (95 kg)  11/20/20 208 lb (94.3 kg)    GEN: Well nourished, well developed in no acute distress NECK: No JVD; No carotid bruits CARDIAC: RRR, no murmurs, no rubs, no gallops RESPIRATORY:  Clear to auscultation without rales, wheezing or rhonchi  ABDOMEN: Soft, non-tender, non-distended EXTREMITIES:  No edema; No deformity   ASSESSMENT AND PLAN: .    Assessment and Plan    Hypertension Chronic hypertension managed with losartan and hydrochlorothiazide.    Type 2 Diabetes Mellitus Well-controlled with current medications including Mounjaro. Has lost weight and maintains an active lifestyle. Discussed the importance  of continuing Mounjaro for weight management and diabetes control. - Continue current diabetes management with Mounjaro  Hyperlipidemia On atorvastatin 10 mg for cardiovascular protection due to diabetes. LDL was 70 at last check. Discussed the benefits of atorvastatin in reducing cardiovascular risk and the importance of continuing therapy for plaque stabilization and heart attack  prevention. - Continue atorvastatin 10 mg daily  Chronic Kidney Disease Mild chronic kidney disease with a creatinine level of 1.44. Sodium and potassium levels are within normal range. Discussed the need for regular monitoring of kidney function. - Monitor kidney function regularly  Gastroesophageal Reflux Disease (GERD) -stable  Obstructive Sleep Apnea Managed with CPAP device. Reports chronic fatigue. Discussed the importance of adherence to CPAP therapy for symptom management. - Continue CPAP therapy  Hypogonadism Managed with weekly testosterone injections. Reports chronic fatigue despite treatment. Discussed continuation of testosterone therapy and monitoring for potential adjustments. - Continue weekly testosterone injections  Chronic Lymphocytic Leukemia (CLL) in Remission In remission and under the care of hematology oncology. No current issues reported. - Continue follow-up with hematology oncology  General Health Maintenance Active, follows a Mediterranean diet, and engages in regular exercise. Discussed the benefits of a Mediterranean diet and regular exercise for overall cardiovascular health. - Encourage continued adherence to Mediterranean diet - Encourage regular exercise (30 minutes daily)  Follow-up - Schedule follow-up appointment in one year. We see Timothy Rocha as well, his wife.               Signed, Donato Schultz, MD

## 2023-04-27 NOTE — Patient Instructions (Signed)

## 2023-04-30 ENCOUNTER — Other Ambulatory Visit: Payer: Self-pay | Admitting: Cardiology

## 2023-05-24 ENCOUNTER — Other Ambulatory Visit: Payer: Self-pay | Admitting: Cardiology

## 2024-03-16 ENCOUNTER — Encounter: Payer: Self-pay | Admitting: Cardiology

## 2024-05-03 ENCOUNTER — Other Ambulatory Visit: Payer: Self-pay | Admitting: Cardiology

## 2024-07-31 ENCOUNTER — Ambulatory Visit: Admitting: Cardiology
